# Patient Record
Sex: Female | Born: 1950
Health system: Southern US, Community
[De-identification: ages and names within clinical notes are randomized; demographics above are authoritative.]

## PROBLEM LIST (undated history)

## (undated) DIAGNOSIS — K219 Gastro-esophageal reflux disease without esophagitis: Secondary | ICD-10-CM

## (undated) DIAGNOSIS — F419 Anxiety disorder, unspecified: Secondary | ICD-10-CM

## (undated) DIAGNOSIS — R112 Nausea with vomiting, unspecified: Secondary | ICD-10-CM

## (undated) DIAGNOSIS — C4491 Basal cell carcinoma of skin, unspecified: Secondary | ICD-10-CM

## (undated) DIAGNOSIS — Z9889 Other specified postprocedural states: Secondary | ICD-10-CM

## (undated) DIAGNOSIS — E7800 Pure hypercholesterolemia, unspecified: Secondary | ICD-10-CM

## (undated) DIAGNOSIS — I10 Essential (primary) hypertension: Secondary | ICD-10-CM

## (undated) DIAGNOSIS — M719 Bursopathy, unspecified: Secondary | ICD-10-CM

## (undated) HISTORY — PX: INCISIONAL BREAST BIOPSY: SHX1812

---
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
## 1998-11-23 ENCOUNTER — Other Ambulatory Visit: Admission: RE | Admit: 1998-11-23 | Discharge: 1998-11-23 | Payer: Self-pay | Admitting: Gynecology

## 1999-12-09 ENCOUNTER — Other Ambulatory Visit: Admission: RE | Admit: 1999-12-09 | Discharge: 1999-12-09 | Payer: Self-pay | Admitting: Gynecology

## 2000-04-12 ENCOUNTER — Other Ambulatory Visit: Admission: RE | Admit: 2000-04-12 | Discharge: 2000-04-12 | Payer: Self-pay | Admitting: Gynecology

## 2000-06-02 ENCOUNTER — Encounter: Admission: RE | Admit: 2000-06-02 | Discharge: 2000-08-31 | Payer: Self-pay | Admitting: Internal Medicine

## 2000-10-02 ENCOUNTER — Other Ambulatory Visit: Admission: RE | Admit: 2000-10-02 | Discharge: 2000-10-02 | Payer: Self-pay | Admitting: Urology

## 2000-12-20 ENCOUNTER — Other Ambulatory Visit: Admission: RE | Admit: 2000-12-20 | Discharge: 2000-12-20 | Payer: Self-pay | Admitting: Gynecology

## 2002-01-02 ENCOUNTER — Other Ambulatory Visit: Admission: RE | Admit: 2002-01-02 | Discharge: 2002-01-02 | Payer: Self-pay | Admitting: Gynecology

## 2003-02-21 ENCOUNTER — Other Ambulatory Visit: Admission: RE | Admit: 2003-02-21 | Discharge: 2003-02-21 | Payer: Self-pay | Admitting: Obstetrics and Gynecology

## 2003-04-03 ENCOUNTER — Encounter: Admission: RE | Admit: 2003-04-03 | Discharge: 2003-04-03 | Payer: Self-pay | Admitting: Surgery

## 2003-04-03 ENCOUNTER — Encounter: Payer: Self-pay | Admitting: Surgery

## 2003-04-03 ENCOUNTER — Encounter (INDEPENDENT_AMBULATORY_CARE_PROVIDER_SITE_OTHER): Payer: Self-pay | Admitting: *Deleted

## 2003-06-06 ENCOUNTER — Ambulatory Visit (HOSPITAL_COMMUNITY): Admission: RE | Admit: 2003-06-06 | Discharge: 2003-06-06 | Payer: Self-pay | Admitting: Gastroenterology

## 2003-06-06 ENCOUNTER — Encounter (INDEPENDENT_AMBULATORY_CARE_PROVIDER_SITE_OTHER): Payer: Self-pay | Admitting: *Deleted

## 2004-03-23 ENCOUNTER — Other Ambulatory Visit: Admission: RE | Admit: 2004-03-23 | Discharge: 2004-03-23 | Payer: Self-pay | Admitting: Obstetrics and Gynecology

## 2005-04-01 ENCOUNTER — Other Ambulatory Visit: Admission: RE | Admit: 2005-04-01 | Discharge: 2005-04-01 | Payer: Self-pay | Admitting: Obstetrics and Gynecology

## 2005-12-22 ENCOUNTER — Encounter: Admission: RE | Admit: 2005-12-22 | Discharge: 2005-12-22 | Payer: Self-pay | Admitting: Obstetrics and Gynecology

## 2006-04-03 ENCOUNTER — Other Ambulatory Visit: Admission: RE | Admit: 2006-04-03 | Discharge: 2006-04-03 | Payer: Self-pay | Admitting: Obstetrics and Gynecology

## 2006-05-08 ENCOUNTER — Encounter: Admission: RE | Admit: 2006-05-08 | Discharge: 2006-05-08 | Payer: Self-pay | Admitting: Internal Medicine

## 2006-12-25 ENCOUNTER — Encounter: Admission: RE | Admit: 2006-12-25 | Discharge: 2006-12-25 | Payer: Self-pay | Admitting: Obstetrics and Gynecology

## 2007-01-08 ENCOUNTER — Encounter: Admission: RE | Admit: 2007-01-08 | Discharge: 2007-01-08 | Payer: Self-pay | Admitting: Obstetrics and Gynecology

## 2007-04-06 ENCOUNTER — Other Ambulatory Visit: Admission: RE | Admit: 2007-04-06 | Discharge: 2007-04-06 | Payer: Self-pay | Admitting: Obstetrics and Gynecology

## 2007-05-09 HISTORY — PX: OTHER SURGICAL HISTORY: SHX169

## 2007-06-05 ENCOUNTER — Ambulatory Visit (HOSPITAL_BASED_OUTPATIENT_CLINIC_OR_DEPARTMENT_OTHER): Admission: RE | Admit: 2007-06-05 | Discharge: 2007-06-06 | Payer: Self-pay | Admitting: Orthopedic Surgery

## 2007-08-07 ENCOUNTER — Encounter: Admission: RE | Admit: 2007-08-07 | Discharge: 2007-08-07 | Payer: Self-pay | Admitting: Obstetrics and Gynecology

## 2007-08-07 ENCOUNTER — Encounter (INDEPENDENT_AMBULATORY_CARE_PROVIDER_SITE_OTHER): Payer: Self-pay | Admitting: Diagnostic Radiology

## 2007-08-09 HISTORY — PX: BREAST EXCISIONAL BIOPSY: SUR124

## 2007-08-21 ENCOUNTER — Encounter: Admission: RE | Admit: 2007-08-21 | Discharge: 2007-08-21 | Payer: Self-pay | Admitting: Surgery

## 2007-09-14 ENCOUNTER — Ambulatory Visit (HOSPITAL_BASED_OUTPATIENT_CLINIC_OR_DEPARTMENT_OTHER): Admission: RE | Admit: 2007-09-14 | Discharge: 2007-09-14 | Payer: Self-pay | Admitting: Surgery

## 2007-09-14 ENCOUNTER — Encounter (INDEPENDENT_AMBULATORY_CARE_PROVIDER_SITE_OTHER): Payer: Self-pay | Admitting: Surgery

## 2007-09-14 ENCOUNTER — Encounter: Admission: RE | Admit: 2007-09-14 | Discharge: 2007-09-14 | Payer: Self-pay | Admitting: Surgery

## 2008-01-15 ENCOUNTER — Encounter: Admission: RE | Admit: 2008-01-15 | Discharge: 2008-01-15 | Payer: Self-pay | Admitting: Surgery

## 2008-04-11 ENCOUNTER — Other Ambulatory Visit: Admission: RE | Admit: 2008-04-11 | Discharge: 2008-04-11 | Payer: Self-pay | Admitting: Obstetrics and Gynecology

## 2008-06-03 ENCOUNTER — Encounter: Admission: RE | Admit: 2008-06-03 | Discharge: 2008-06-03 | Payer: Self-pay | Admitting: Family Medicine

## 2009-01-15 ENCOUNTER — Encounter: Admission: RE | Admit: 2009-01-15 | Discharge: 2009-01-15 | Payer: Self-pay | Admitting: Obstetrics and Gynecology

## 2009-09-30 ENCOUNTER — Encounter: Admission: RE | Admit: 2009-09-30 | Discharge: 2009-09-30 | Payer: Self-pay | Admitting: Gastroenterology

## 2010-01-19 ENCOUNTER — Encounter: Admission: RE | Admit: 2010-01-19 | Discharge: 2010-01-19 | Payer: Self-pay | Admitting: Obstetrics and Gynecology

## 2010-08-29 ENCOUNTER — Encounter: Payer: Self-pay | Admitting: Obstetrics and Gynecology

## 2010-12-20 ENCOUNTER — Other Ambulatory Visit: Payer: Self-pay | Admitting: Surgery

## 2010-12-20 DIAGNOSIS — Z9889 Other specified postprocedural states: Secondary | ICD-10-CM

## 2010-12-21 NOTE — Op Note (Signed)
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   OPERATION:  Needle guided excisional biopsy.   SURGEON:  Currie Paris, M.D.   ANESTHESIA:  MAC.   CLINICAL HISTORY:  This is a 60 year old lady that had an area of  calcifications develop and biopsy performed.  It showed some LCIS but  there was some concern that there was also some other underlying process  and could be adjacent carcinoma.  Therefore a needle guided excisional  biopsy was recommended.  A preoperative MRI showed only post biopsy  changes in the left breast and nothing in the right.   DESCRIPTION OF PROCEDURE:  The patient was seen in the holding area and  she had no further questions.  We confirmed the left breast as the  operative side and she and I both initialed that side.  I reviewed the  mammogram films with the guidewire placement.   The patient was taken to the operating room and given satisfactory IV  sedation.  The left breast was prepped and draped.  The time-out was  performed.   A combination of 1% Xylocaine mixed equally with 0.5% Marcaine was used  for local.  The guidewire entered at about the 10 o'clock position above  the areolar margin by about 4 cm and seemed to track inferiorly towards  the areola and laterally.  I made my incision about 1.5 cm below the  guidewire entry site.  I divided the fatty tissue until I got down to  where I saw more dense tissue  consistent with the beginning of breast  tissue.  I then elevated the superior flap over so I could see the  guidewire and manipulated that into the wound.   I then began taking an excisional biopsy around the guidewire fairly  much of a cylinder of tissue around it which therefore was going  inferior and lateral and deep from the entry of the guidewire.  Along  the inferior margin I ran across a very hard area that I divided through  as we were coming down and continued until I got beyond the tip of the  wire which was going to be well beyond the area seen on specimen  mammogram.  This was all done with cautery.  I oriented the specimen  somewhat as best I could for the pathologist.  We did a specimen  mammogram which showed the clip in the middle of the tissue removed.  I  had also put a surgical clip on the edge of the specimen in the area  that  I thought was worrisome by palpation.  I spoke with Dr. Manson Passey who  confirmed the removal of the clip and was concerned also about a little  spiculated density between the clip I had placed and the clip placed at  her biopsy.   I went back to examine the breast and went ahead and took an extra about  1 cm thick tissue from the inferior margin at the area that I had  thought was close on and by the hard area.  This was sent as a separate  specimen, labeled and oriented as to the new deep margin.   I spent several minutes irrigating.  I put more local in as we worked  and the patient remained comfortable.  Once I thought everything was dry  I closed some of the deeper tissue to cover the muscle which I had  gotten down to as deep margin.  I then closed the more superficial  tissue with 3-0 Vicryl followed by 4-0 Monocryl subcuticular and  Dermabond.  The patient tolerated the procedure well and there were no  complications.  All counts were correct.      Currie Paris, M.D.  Electronically Signed     CJS/MEDQ  D:  09/14/2007  T:   09/15/2007  Job:  [id number]   cc:   Olene Craven, M.D.

## 2010-12-21 NOTE — Op Note (Signed)
Haley [name]              ACCOUNT NO.:  [id number]   MEDICAL RECORD NO.:  [id number]          PATIENT TYPE:  AMB   LOCATION:  DSC                          FACILITY:  [facility]   PHYSICIAN:  Katy Fitch. Sypher, M.D. DATE OF BIRTH:  05-29-1951   DATE OF PROCEDURE:  06/05/2007  DATE OF DISCHARGE:                               OPERATIVE REPORT   PREOPERATIVE DIAGNOSIS:  Chronic right rotator cuff tear with plain film  evidence of significant acromioclavicular joint degenerative arthritis  and a retracted rotator cuff tear involving the supraspinatus and  conjoint tendon documented by preoperative MRI.   POSTOPERATIVE DIAGNOSES:  1. Chronic right rotator cuff tear with plain film evidence of      significant acromioclavicular joint degenerative arthritis and a      retracted rotator cuff tear involving the supraspinatus and      conjoint tendon documented by preoperative MRI.  2. 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   OPERATION:  1. Examination of right shoulder under anesthesia documenting capsular      stability.  2. Arthroscopic debridement of right glenohumeral joint including      labral degenerative tear and synovitis with documentation of a thin      membrane of intact supraspinatus and infraspinatus tendons on the      joint side.  3. Arthroscopic subacromial decompression with bursectomy,      coracoacromial ligament relaxation and acromioplasty, with      documentation of 80+ percent retracted supraspinatus and conjoint      tendon rotator cuff tear.  4. Arthroscopic distal clavicle resection.  5. Open reconstruction of supraspinatus and infraspinatus rotator cuff      tendons with debridement of necrotic tendon and advancement of      tendon to anatomic footprint on greater tuberosity utilizing a bio-      corkscrew medial footprint anchor and a McLaughlin through-bone  lateralization suture of the conjoint tendon.   OPERATING SURGEON:  Katy Fitch. Sypher, M.D.   ASSISTANT:  Annye Rusk, PA-C.   ANESTHESIA:  General by endotracheal technique, supervising  anesthesiologist is Bedelia Person, M.D.  Note that Dr. Gypsy Balsam placed a  preoperative interscalene block for perioperative analgesia.   ESTIMATED BLOOD LOSS:  100 mL.  No replacement.   PREOPERATIVE ANTIBIOTICS:  Ancef 1 g IV.   COMPLICATIONS:  None apparent.   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   Clinical examination revealed signs of AC arthropathy and a significant  stage II or III impingement.  An MRI of the shoulder documented a  retracted supraspinatus and conjoint tendon tear that was thought to be  full-thickness by the radiologist.   Due to a failure to respond to nonoperative measures, she is brought to  the operating room at this time anticipating arthroscopic evaluation of  the glenohumeral joint with appropriate intervention, followed by  arthroscopic subacromial decompression, distal clavicle resection and  probable open reconstruction  of her rotator cuff.   We were prepared to consider and execute an arthroscopic repair  depending on our findings; however, given our identification of a  partial-thickness tear, in my judgment open technique offered  advantages.   After informed consent and a repeat period of questions and answers in  the holding area, [name] was brought to the operating room at this  time.   PROCEDURE:  [name]. [name] was brought to the operating room and  placed in supine position on the operating table.   Following an anesthesia consult with Dr. Gypsy Balsam, a right interscalene  block was placed without complication.   [name] was brought to room #1, placed in supine position upon the  table and, under Dr. Burnett Corrente strict supervision,  general endotracheal  anesthesia induced.   She was carefully positioned in the beach-chair position with a aid of a  torso and head holder designed for shoulder arthroscopy.  Preoperative  examination of the shoulder under anesthesia revealed combined elevation  to 190 degrees, external rotation 90 degrees at 90 degrees of abduction,  internal rotation of 80 degrees at 90 degrees abduction.  She had no  sign of anterior, inferior or posterior instability.   The arm was then prepped with DuraPrep and draped with impervious  arthroscopy drapes.  The scope was placed through a standard posterior  viewing portal.  Diagnostic arthroscopy revealed intact hyaline  articular cartilage surfaces on the glenoid and humeral head.  There was  considerable synovitis and degenerative labral tearing from 2 o'clock  anteriorly to 10 o'clock posteriorly.  The inferior recess was inspected  and found to have a thick glenohumeral effusion but no sign of  significant loose bodies.   The deep surface of the rotator cuff was inspected and except for a  small rotator cuff interval tear anteriorly, the joint side of the  tendons were at least intact with a membrane.  The biceps had a 25%  degenerative tear at the entrance into the bicipital groove.   A suction shaver was introduced and the labrum synovitis and biceps as  well as the anterior supraspinatus were all debrided to a smooth margin.  Hemostasis was achieved with the bipolar cautery.   The scope was then removed from the glenohumeral joint and placed in the  subacromial space.  Florid bursitis was noted.  After bursectomy, the  coracoacromial ligament was relaxed with the cutting cautery, followed  by leveling the acromion to a type 1 morphology.  The distal clavicle  capsule was taken down.  Photographic documentation of the arthritis was  accomplished with a digital camera, followed by resection of the distal  15 mm of clavicle with the  endoscopic bur.  Hemostasis was achieved.  The bursal side tear was easily identified and documented with the  digital camera.  This involved the entire supraspinatus tendon and a  portion of the conjoint tendon.  It had retracted more than 2 cm.   At this point due to the partial-thickness nature of the tear, I elected  to proceed to open reconstruction of the rotator cuff.   A 3-cm incision was fashioned at the junction of the anterior and middle  thirds of the deltoid.  The bursa was resected and the cuff margins  freshened with the 15 scalpel blade.   A gathering #2 FiberWire was placed with care to relaminate the tendon  that had retracted medially.  Bone tunnels were created with an RC  needle and,  in the case the anterior tunnel, the Concept drill set.  Ms.  Ebbert has a bone island in her greater tuberosity, which rendered bone  tunneling quite challenging.   Ultimately a proper McLaughlin suture was placed, which anatomically  lateralized the conjoint tendon.  A single 5.5-mm bio-corkscrew anchor  was placed medially after decortication of the greater tuberosity and  lowering the profile of the tuberosity approximately 3 mm.   The tendon was advanced laterally and the mattress sutures inset,  creating an excellent medial footprint and relaminating the tendon.  An  anatomic repair was achieved.  The margin of the repair was finished  with a 0 Vicryl figure-of-eight suture brought through the periosteum  laterally, insetting the edge smoothly.   The subacromial space was then power-lavaged with the arthroscopic pump,  followed by repair of the deltoid with an apical corner suture of #2  FiberWire and figure-of-eight sutures of 0 Vicryl.  The skin was  repaired with subdermal suture of 2-0 Vicryl with intradermal 3-0  Prolene with Steri-Strips.   Ms. Ivens tolerated the surgery and anesthesia well.  She was  transferred to the recovery room with stable vital  signs.      Katy Fitch Sypher, M.D.  Electronically Signed     RVS/MEDQ  D:  06/05/2007  T:  06/05/2007  Job:  [id number]   cc:   Olene Craven, M.D.

## 2010-12-24 NOTE — Op Note (Signed)
Haley [name], Haley [name]                        ACCOUNT NO.:  [id number]   MEDICAL RECORD NO.:  [id number]                   PATIENT TYPE:  AMB   LOCATION:  ENDO                                 FACILITY:  [facility]   PHYSICIAN:  Anselmo Rod, M.D.               DATE OF BIRTH:  04-Apr-1951   DATE OF PROCEDURE:  06/06/2003  DATE OF DISCHARGE:                                 OPERATIVE REPORT   PROCEDURE PERFORMED:  Colonoscopy with snare polypectomy times one.   ENDOSCOPIST:  Charna Elizabeth, M.D.   INSTRUMENT USED:  Olympus video colonoscope.   INDICATIONS FOR PROCEDURE:  The patient is a 60 year old white female with a  history of rectal bleeding.  Rule out colonic polyps, masses, etc.   PREPROCEDURE PREPARATION:  Informed consent was procured from the patient.  The patient was fasted for eight hours prior to the procedure and prepped  with a bottle of magnesium citrate and a gallon of GoLYTELY the night prior  to the procedure.   PREPROCEDURE PHYSICAL:  The patient had stable vital signs.  Neck supple.  Chest clear to auscultation.  S1 and S2 regular.  Abdomen soft with normal  bowel sounds.   DESCRIPTION OF PROCEDURE:  The patient was placed in left lateral decubitus  position and sedated with 100 mg of Demerol and 10 mg of Versed  intravenously.  Once the patient was adequately sedated and maintained on  low flow oxygen and continuous cardiac monitoring, the Olympus video  colonoscope was advanced from the rectum to the cecum without difficulty.  The patient had a very tortuous atonic colon.  The polyp was snared from the  midhepatic flexure.  Small internal and external hemorrhoids were seen on  anal inspection and retroflexion in the rectum.  There was a large amount of  residual stool in the colon and multiple washes were done.  The patient's  position was changed from the left lateral to the supine and the right  lateral position. With gentle application of abdominal pressure  to reach the  cecum, the appendicular orifice and ileocecal valve were clearly visualized.  The terminal ileum was not seen.  The patient tolerated the procedure well  without complications.   IMPRESSION:  1. Prominent internal and external hemorrhoids.  2. Small polyp snared from midtransverse colon.  3. No evidence of diverticulosis.   RECOMMENDATIONS:  1. Await pathology results.  2. Repeat colorectal cancer screening depending on pathology results.  3. Avoid aspirin and all other nonsteroidals for the next two to three     weeks.  4. Outpatient follow-up in the next two weeks or earlier if need be for     further recommendations.  Anselmo Rod, M.D.    JNM/MEDQ  D:  06/06/2003  T:  06/06/2003  Job:  [id number]   cc:   Olene Craven, M.D.  [address]  [address]  [place]  [place] [address]  Fax: [phone]   Edwena Felty. Romine, M.D.  [address]  [place]  [place] [address]  Fax: [phone]

## 2011-01-21 ENCOUNTER — Ambulatory Visit
Admission: RE | Admit: 2011-01-21 | Discharge: 2011-01-21 | Disposition: A | Discharge status: Home / Self Care | Payer: Commercial Managed Care - PPO | Source: Ambulatory Visit | Attending: Surgery | Admitting: Surgery

## 2011-01-21 DIAGNOSIS — Z9889 Other specified postprocedural states: Secondary | ICD-10-CM

## 2011-04-29 LAB — BASIC METABOLIC PANEL
Calcium: 9.4
Creatinine, Ser: 0.61
GFR calc Af Amer: >60
GFR calc non Af Amer: >60
Sodium: 134 — ABNORMAL LOW

## 2011-05-18 LAB — BASIC METABOLIC PANEL
CO2: 30
Chloride: 96
GFR calc Af Amer: >60
Glucose, Bld: 118 — ABNORMAL HIGH
Sodium: 133 — ABNORMAL LOW

## 2011-05-18 LAB — POCT HEMOGLOBIN-HEMACUE: Hemoglobin: 15.1 — ABNORMAL HIGH

## 2011-06-08 ENCOUNTER — Other Ambulatory Visit (HOSPITAL_COMMUNITY)
Admission: RE | Admit: 2011-06-08 | Discharge: 2011-06-08 | Disposition: A | Discharge status: Home / Self Care | Payer: BC Managed Care – PPO | Source: Ambulatory Visit | Attending: Family Medicine | Admitting: Family Medicine

## 2011-06-08 ENCOUNTER — Other Ambulatory Visit: Payer: Self-pay | Admitting: Family Medicine

## 2011-06-08 DIAGNOSIS — Z0000 Encounter for general adult medical examination without abnormal findings: Secondary | ICD-10-CM | POA: Insufficient documentation

## 2011-12-05 ENCOUNTER — Other Ambulatory Visit: Payer: Self-pay | Admitting: Dermatology

## 2011-12-19 ENCOUNTER — Other Ambulatory Visit: Payer: Self-pay | Admitting: Family Medicine

## 2011-12-19 DIAGNOSIS — Z853 Personal history of malignant neoplasm of breast: Secondary | ICD-10-CM

## 2011-12-19 DIAGNOSIS — Z9889 Other specified postprocedural states: Secondary | ICD-10-CM

## 2012-01-25 ENCOUNTER — Ambulatory Visit
Admission: RE | Admit: 2012-01-25 | Discharge: 2012-01-25 | Disposition: A | Discharge status: Home / Self Care | Payer: BC Managed Care – PPO | Source: Ambulatory Visit | Attending: Family Medicine | Admitting: Family Medicine

## 2012-01-25 DIAGNOSIS — Z9889 Other specified postprocedural states: Secondary | ICD-10-CM

## 2012-01-25 DIAGNOSIS — Z853 Personal history of malignant neoplasm of breast: Secondary | ICD-10-CM

## 2012-03-29 ENCOUNTER — Other Ambulatory Visit: Payer: Self-pay | Admitting: Dermatology

## 2012-12-17 ENCOUNTER — Other Ambulatory Visit: Payer: Self-pay | Admitting: Family Medicine

## 2012-12-17 DIAGNOSIS — Z1231 Encounter for screening mammogram for malignant neoplasm of breast: Secondary | ICD-10-CM

## 2013-01-25 ENCOUNTER — Ambulatory Visit: Payer: BC Managed Care – PPO

## 2013-02-05 ENCOUNTER — Ambulatory Visit
Admission: RE | Admit: 2013-02-05 | Discharge: 2013-02-05 | Disposition: A | Discharge status: Home / Self Care | Payer: BC Managed Care – PPO | Source: Ambulatory Visit | Attending: Family Medicine | Admitting: Family Medicine

## 2013-02-05 DIAGNOSIS — Z1231 Encounter for screening mammogram for malignant neoplasm of breast: Secondary | ICD-10-CM

## 2013-12-13 ENCOUNTER — Other Ambulatory Visit (HOSPITAL_COMMUNITY)
Admission: RE | Admit: 2013-12-13 | Discharge: 2013-12-13 | Disposition: A | Discharge status: Home / Self Care | Payer: 59 | Source: Ambulatory Visit | Attending: Family Medicine | Admitting: Family Medicine

## 2013-12-13 ENCOUNTER — Other Ambulatory Visit: Payer: Self-pay | Admitting: Family Medicine

## 2013-12-13 DIAGNOSIS — Z0000 Encounter for general adult medical examination without abnormal findings: Secondary | ICD-10-CM | POA: Insufficient documentation

## 2014-01-09 ENCOUNTER — Other Ambulatory Visit: Payer: Self-pay

## 2014-01-09 DIAGNOSIS — Z1231 Encounter for screening mammogram for malignant neoplasm of breast: Secondary | ICD-10-CM

## 2014-02-06 ENCOUNTER — Encounter (INDEPENDENT_AMBULATORY_CARE_PROVIDER_SITE_OTHER): Payer: Self-pay

## 2014-02-06 ENCOUNTER — Ambulatory Visit
Admission: RE | Admit: 2014-02-06 | Discharge: 2014-02-06 | Disposition: A | Discharge status: Home / Self Care | Payer: 59 | Source: Ambulatory Visit

## 2014-02-06 DIAGNOSIS — Z1231 Encounter for screening mammogram for malignant neoplasm of breast: Secondary | ICD-10-CM

## 2014-02-13 ENCOUNTER — Other Ambulatory Visit: Payer: Self-pay | Admitting: Family Medicine

## 2014-02-13 DIAGNOSIS — R928 Other abnormal and inconclusive findings on diagnostic imaging of breast: Secondary | ICD-10-CM

## 2014-02-20 ENCOUNTER — Ambulatory Visit
Admission: RE | Admit: 2014-02-20 | Discharge: 2014-02-20 | Disposition: A | Discharge status: Home / Self Care | Payer: 59 | Source: Ambulatory Visit | Attending: Family Medicine | Admitting: Family Medicine

## 2014-02-20 DIAGNOSIS — R928 Other abnormal and inconclusive findings on diagnostic imaging of breast: Secondary | ICD-10-CM

## 2014-05-23 ENCOUNTER — Other Ambulatory Visit: Payer: Self-pay | Admitting: Family Medicine

## 2014-05-23 DIAGNOSIS — N644 Mastodynia: Secondary | ICD-10-CM

## 2014-06-03 ENCOUNTER — Encounter (INDEPENDENT_AMBULATORY_CARE_PROVIDER_SITE_OTHER): Payer: Self-pay

## 2014-06-03 ENCOUNTER — Ambulatory Visit
Admission: RE | Admit: 2014-06-03 | Discharge: 2014-06-03 | Disposition: A | Discharge status: Home / Self Care | Payer: BC Managed Care – PPO | Source: Ambulatory Visit | Attending: Family Medicine | Admitting: Family Medicine

## 2014-06-03 DIAGNOSIS — N644 Mastodynia: Secondary | ICD-10-CM

## 2014-10-07 ENCOUNTER — Other Ambulatory Visit: Payer: Self-pay | Admitting: Dermatology

## 2015-01-19 ENCOUNTER — Other Ambulatory Visit: Payer: Self-pay

## 2015-01-19 DIAGNOSIS — Z1231 Encounter for screening mammogram for malignant neoplasm of breast: Secondary | ICD-10-CM

## 2015-02-23 ENCOUNTER — Ambulatory Visit
Admission: RE | Admit: 2015-02-23 | Discharge: 2015-02-23 | Disposition: A | Discharge status: Home / Self Care | Payer: BLUE CROSS/BLUE SHIELD | Source: Ambulatory Visit

## 2015-02-23 DIAGNOSIS — Z1231 Encounter for screening mammogram for malignant neoplasm of breast: Secondary | ICD-10-CM

## 2016-01-28 ENCOUNTER — Other Ambulatory Visit: Payer: Self-pay | Admitting: Family Medicine

## 2016-01-28 DIAGNOSIS — Z853 Personal history of malignant neoplasm of breast: Secondary | ICD-10-CM

## 2016-01-28 DIAGNOSIS — Z1231 Encounter for screening mammogram for malignant neoplasm of breast: Secondary | ICD-10-CM

## 2016-02-23 ENCOUNTER — Ambulatory Visit
Admission: RE | Admit: 2016-02-23 | Discharge: 2016-02-23 | Disposition: A | Discharge status: Home / Self Care | Payer: BLUE CROSS/BLUE SHIELD | Source: Ambulatory Visit | Attending: Family Medicine | Admitting: Family Medicine

## 2016-02-23 ENCOUNTER — Other Ambulatory Visit: Payer: Self-pay | Admitting: Family Medicine

## 2016-02-23 DIAGNOSIS — M542 Cervicalgia: Secondary | ICD-10-CM

## 2016-02-24 ENCOUNTER — Ambulatory Visit
Admission: RE | Admit: 2016-02-24 | Discharge: 2016-02-24 | Disposition: A | Discharge status: Home / Self Care | Payer: BLUE CROSS/BLUE SHIELD | Source: Ambulatory Visit | Attending: Family Medicine | Admitting: Family Medicine

## 2016-02-24 DIAGNOSIS — Z1231 Encounter for screening mammogram for malignant neoplasm of breast: Secondary | ICD-10-CM

## 2016-02-24 DIAGNOSIS — Z853 Personal history of malignant neoplasm of breast: Secondary | ICD-10-CM

## 2016-09-01 ENCOUNTER — Other Ambulatory Visit (HOSPITAL_COMMUNITY)
Admission: RE | Admit: 2016-09-01 | Discharge: 2016-09-01 | Disposition: A | Discharge status: Home / Self Care | Payer: BLUE CROSS/BLUE SHIELD | Source: Ambulatory Visit | Attending: Family Medicine | Admitting: Family Medicine

## 2016-09-01 ENCOUNTER — Other Ambulatory Visit: Payer: Self-pay | Admitting: Family Medicine

## 2016-09-01 ENCOUNTER — Other Ambulatory Visit: Payer: Self-pay | Admitting: Orthopedic Surgery

## 2016-09-01 DIAGNOSIS — T148XXA Other injury of unspecified body region, initial encounter: Secondary | ICD-10-CM

## 2016-09-01 DIAGNOSIS — Z124 Encounter for screening for malignant neoplasm of cervix: Secondary | ICD-10-CM | POA: Insufficient documentation

## 2016-09-02 LAB — CYTOLOGY - PAP: DIAGNOSIS: NEGATIVE

## 2016-09-05 ENCOUNTER — Ambulatory Visit
Admission: RE | Admit: 2016-09-05 | Discharge: 2016-09-05 | Disposition: A | Discharge status: Home / Self Care | Payer: BLUE CROSS/BLUE SHIELD | Source: Ambulatory Visit | Attending: Orthopedic Surgery | Admitting: Orthopedic Surgery

## 2016-09-05 DIAGNOSIS — T148XXA Other injury of unspecified body region, initial encounter: Secondary | ICD-10-CM

## 2016-11-03 ENCOUNTER — Ambulatory Visit (INDEPENDENT_AMBULATORY_CARE_PROVIDER_SITE_OTHER): Payer: BLUE CROSS/BLUE SHIELD | Admitting: Orthopedic Surgery

## 2016-11-03 ENCOUNTER — Ambulatory Visit (INDEPENDENT_AMBULATORY_CARE_PROVIDER_SITE_OTHER): Payer: BLUE CROSS/BLUE SHIELD

## 2016-11-03 DIAGNOSIS — M76822 Posterior tibial tendinitis, left leg: Secondary | ICD-10-CM | POA: Diagnosis not present

## 2016-11-03 DIAGNOSIS — M79672 Pain in left foot: Secondary | ICD-10-CM | POA: Diagnosis not present

## 2016-11-03 NOTE — Progress Notes (Signed)
Office Visit Note   Patient: Haley [name]           Date of Birth: 16-Feb-1951           MRN: [id number] Visit Date: 11/03/2016              Requested by: Harlan Stains, MD [address], [place] [address] PCP: No primary care provider on file.  Chief Complaint  Patient presents with  . Left Foot - Pain      HPI: Patient is a 66 year old woman who has seen Dr. Beola Cord and also seen Dr. Doran Durand. She was recommended ice and orthotics were made for her at [facility]. She has pain over the posterior tibial tendon pain across the arch she has a mobilizer self this past week it is actually feeling better after decreasing her activities. Patient complains of pain and swelling along the posterior tibial tendon.  Assessment & Plan: Visit Diagnoses:  1. Pain in left foot   2. 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  Plan: Recommended a harder orthotics to provide better arch support such as sole orthotics. Discussed that if her symptoms worsen she can wear her fracture boot with the orthotic in place. Discussed that if she fails conservative treatment her option would be to consider a talonavicular and subtalar fusion to support the insufficiency of the posterior tibial tendon. We had a long discussion regarding conservative therapy and I have encouraged conservative therapy. Follow-up as needed.  Follow-Up Instructions: Return if symptoms worsen or fail to improve.   Ortho Exam  Patient is alert, oriented, no adenopathy, well-dressed, normal affect, normal respiratory effort. Examination patient is alert oriented no adenopathy well-dressed normal affect normal respiratory effort she has a good dorsalis pes and posterior tibial pulse. She has advanced collapse with posterior tibial tendon insufficiency with a pronated valgus forefoot. Her subtalar joint is essentially non-mobile due to  the collapse. She has some tenderness to palpation of the sinus Tarsi tenderness and swelling to palpation along the posterior tibial tendon. Patient cannot do a single limb heel raise and cannot even get her heel off the ground.  Imaging: Xr Foot 2 Views Left  Result Date: 11/03/2016 2 view radiographs of the left foot shows pes planus with a non-congruent joint at the tibiotalar joint. Patient on a nonweightbearing film has good alignment of the medial column. Radiographs show collapse through the subtalar joint.   Labs: No results found for: HGBA1C, ESRSEDRATE, CRP, LABURIC, REPTSTATUS, GRAMSTAIN, CULT, LABORGA  Orders:  Orders Placed This Encounter  Procedures  . XR Foot 2 Views Left   No orders of the defined types were placed in this encounter.    Procedures: No procedures performed  Clinical Data: No additional findings.  ROS:  All other systems negative, except as noted in the HPI. Review of Systems  Objective: Vital Signs: There were no vitals taken for this visit.  Specialty Comments:  No specialty comments available.  PMFS History: There are no active problems to display for this patient.  No past medical history on file.  No family history on file.  No past surgical history on file. Social History   Occupational History  . Not on file.   Social History Main Topics  . Smoking status: Not on file  . Smokeless tobacco: Not on file  . Alcohol use Not on file  . Drug use: Unknown  . Sexual activity:  Not on file

## 2017-01-13 ENCOUNTER — Other Ambulatory Visit: Payer: Self-pay | Admitting: Family Medicine

## 2017-01-13 DIAGNOSIS — Z1231 Encounter for screening mammogram for malignant neoplasm of breast: Secondary | ICD-10-CM

## 2017-02-24 ENCOUNTER — Ambulatory Visit
Admission: RE | Admit: 2017-02-24 | Discharge: 2017-02-24 | Disposition: A | Discharge status: Home / Self Care | Payer: BLUE CROSS/BLUE SHIELD | Source: Ambulatory Visit | Attending: Family Medicine | Admitting: Family Medicine

## 2017-02-24 DIAGNOSIS — Z1231 Encounter for screening mammogram for malignant neoplasm of breast: Secondary | ICD-10-CM

## 2017-11-29 ENCOUNTER — Ambulatory Visit: Payer: BLUE CROSS/BLUE SHIELD | Admitting: Podiatry

## 2017-12-20 DIAGNOSIS — C44619 Basal cell carcinoma of skin of left upper limb, including shoulder: Secondary | ICD-10-CM | POA: Diagnosis not present

## 2018-01-22 ENCOUNTER — Other Ambulatory Visit: Payer: Self-pay | Admitting: Family Medicine

## 2018-01-22 DIAGNOSIS — Z1231 Encounter for screening mammogram for malignant neoplasm of breast: Secondary | ICD-10-CM

## 2018-01-26 DIAGNOSIS — M7061 Trochanteric bursitis, right hip: Secondary | ICD-10-CM | POA: Diagnosis not present

## 2018-01-26 DIAGNOSIS — M1611 Unilateral primary osteoarthritis, right hip: Secondary | ICD-10-CM | POA: Diagnosis not present

## 2018-01-29 DIAGNOSIS — H2513 Age-related nuclear cataract, bilateral: Secondary | ICD-10-CM | POA: Diagnosis not present

## 2018-01-29 DIAGNOSIS — H5203 Hypermetropia, bilateral: Secondary | ICD-10-CM | POA: Diagnosis not present

## 2018-02-07 DIAGNOSIS — M1611 Unilateral primary osteoarthritis, right hip: Secondary | ICD-10-CM | POA: Diagnosis not present

## 2018-02-26 ENCOUNTER — Ambulatory Visit
Admission: RE | Admit: 2018-02-26 | Discharge: 2018-02-26 | Disposition: A | Discharge status: Home / Self Care | Payer: Medicare Other | Source: Ambulatory Visit | Attending: Family Medicine | Admitting: Family Medicine

## 2018-02-26 DIAGNOSIS — Z1231 Encounter for screening mammogram for malignant neoplasm of breast: Secondary | ICD-10-CM | POA: Diagnosis not present

## 2018-03-08 DIAGNOSIS — M7061 Trochanteric bursitis, right hip: Secondary | ICD-10-CM | POA: Diagnosis not present

## 2018-03-08 DIAGNOSIS — M1611 Unilateral primary osteoarthritis, right hip: Secondary | ICD-10-CM | POA: Diagnosis not present

## 2018-03-09 DIAGNOSIS — M7061 Trochanteric bursitis, right hip: Secondary | ICD-10-CM | POA: Diagnosis not present

## 2018-03-23 DIAGNOSIS — M1611 Unilateral primary osteoarthritis, right hip: Secondary | ICD-10-CM | POA: Diagnosis not present

## 2018-03-23 DIAGNOSIS — M7061 Trochanteric bursitis, right hip: Secondary | ICD-10-CM | POA: Diagnosis not present

## 2018-04-06 DIAGNOSIS — R21 Rash and other nonspecific skin eruption: Secondary | ICD-10-CM | POA: Diagnosis not present

## 2018-04-16 DIAGNOSIS — F419 Anxiety disorder, unspecified: Secondary | ICD-10-CM | POA: Diagnosis not present

## 2018-04-16 DIAGNOSIS — E785 Hyperlipidemia, unspecified: Secondary | ICD-10-CM | POA: Diagnosis not present

## 2018-04-16 DIAGNOSIS — K120 Recurrent oral aphthae: Secondary | ICD-10-CM | POA: Diagnosis not present

## 2018-04-16 DIAGNOSIS — E871 Hypo-osmolality and hyponatremia: Secondary | ICD-10-CM | POA: Diagnosis not present

## 2018-04-16 DIAGNOSIS — G4700 Insomnia, unspecified: Secondary | ICD-10-CM | POA: Diagnosis not present

## 2018-04-16 DIAGNOSIS — E559 Vitamin D deficiency, unspecified: Secondary | ICD-10-CM | POA: Diagnosis not present

## 2018-04-16 DIAGNOSIS — I10 Essential (primary) hypertension: Secondary | ICD-10-CM | POA: Diagnosis not present

## 2018-04-16 DIAGNOSIS — Z23 Encounter for immunization: Secondary | ICD-10-CM | POA: Diagnosis not present

## 2018-05-02 DIAGNOSIS — L299 Pruritus, unspecified: Secondary | ICD-10-CM | POA: Diagnosis not present

## 2018-05-07 DIAGNOSIS — L309 Dermatitis, unspecified: Secondary | ICD-10-CM | POA: Diagnosis not present

## 2018-05-07 DIAGNOSIS — Z23 Encounter for immunization: Secondary | ICD-10-CM | POA: Diagnosis not present

## 2018-05-07 DIAGNOSIS — L905 Scar conditions and fibrosis of skin: Secondary | ICD-10-CM | POA: Diagnosis not present

## 2018-05-14 NOTE — Patient Instructions (Signed)
Haley [name]  05/14/2018   Your procedure is scheduled on: 05-23-18   Report to [facility] Main  Entrance    Report to admitting at 8:30AM    Call this number if you have problems the morning of surgery [phone]     Remember: Do not eat food or drink liquids :After Midnight. BRUSH YOUR TEETH MORNING OF SURGERY AND RINSE YOUR MOUTH OUT, NO CHEWING GUM CANDY OR MINTS.     Take these medicines the morning of surgery with A SIP OF WATER: amlodipine, Claritin, xanax if needed                                You may not have any metal on your body including hair pins and              piercings  Do not wear jewelry, make-up, lotions, powders or perfumes, deodorant             Do not wear nail polish.  Do not shave  48 hours prior to surgery.      Do not bring valuables to the hospital. [place].  Contacts, dentures or bridgework may not be worn into surgery.  Leave suitcase in the car. After surgery it may be brought to your room.                 Please read over the following fact sheets you were given: _____________________________________________________________________             [facility] - Preparing for Surgery Before surgery, you can play an important role.  Because skin is not sterile, your skin needs to be as free of germs as possible.  You can reduce the number of germs on your skin by washing with CHG (chlorahexidine gluconate) soap before surgery.  CHG is an antiseptic cleaner which kills germs and bonds with the skin to continue killing germs even after washing. Please DO NOT use if you have an allergy to CHG or antibacterial soaps.  If your skin becomes reddened/irritated stop using the CHG and inform your nurse when you arrive at Short Stay. Do not shave (including legs and underarms) for at least 48 hours prior to the first CHG shower.  You may shave your face/neck. Please follow  these instructions carefully:  1.  Shower with CHG Soap the night before surgery and the  morning of Surgery.  2.  If you choose to wash your hair, wash your hair first as usual with your  normal  shampoo.  3.  After you shampoo, rinse your hair and body thoroughly to remove the  shampoo.                           4.  Use CHG as you would any other liquid soap.  You can apply chg directly  to the skin and wash                       Gently with a scrungie or clean washcloth.  5.  Apply the CHG Soap to your body ONLY FROM THE NECK DOWN.   Do not use on face/ open  Wound or open sores. Avoid contact with eyes, ears mouth and genitals (private parts).                       Wash face,  Genitals (private parts) with your normal soap.             6.  Wash thoroughly, paying special attention to the area where your surgery  will be performed.  7.  Thoroughly rinse your body with warm water from the neck down.  8.  DO NOT shower/wash with your normal soap after using and rinsing off  the CHG Soap.                9.  Pat yourself dry with a clean towel.            10.  Wear clean pajamas.            11.  Place clean sheets on your bed the night of your first shower and do not  sleep with pets. Day of Surgery : Do not apply any lotions/deodorants the morning of surgery.  Please wear clean clothes to the hospital/surgery center.  FAILURE TO FOLLOW THESE INSTRUCTIONS MAY RESULT IN THE CANCELLATION OF YOUR SURGERY PATIENT SIGNATURE_________________________________  NURSE SIGNATURE__________________________________  ________________________________________________________________________   Adam Phenix  An incentive spirometer is a tool that can help keep your lungs clear and active. This tool measures how well you are filling your lungs with each breath. Taking long deep breaths may help reverse or decrease the chance of developing breathing (pulmonary) problems  (especially infection) following:  A long period of time when you are unable to move or be active. BEFORE THE PROCEDURE   If the spirometer includes an indicator to show your best effort, your nurse or respiratory therapist will set it to a desired goal.  If possible, sit up straight or lean slightly forward. Try not to slouch.  Hold the incentive spirometer in an upright position. INSTRUCTIONS FOR USE  1. Sit on the edge of your bed if possible, or sit up as far as you can in bed or on a chair. 2. Hold the incentive spirometer in an upright position. 3. Breathe out normally. 4. Place the mouthpiece in your mouth and seal your lips tightly around it. 5. Breathe in slowly and as deeply as possible, raising the piston or the ball toward the top of the column. 6. Hold your breath for 3-5 seconds or for as long as possible. Allow the piston or ball to fall to the bottom of the column. 7. Remove the mouthpiece from your mouth and breathe out normally. 8. Rest for a few seconds and repeat Steps 1 through 7 at least 10 times every 1-2 hours when you are awake. Take your time and take a few normal breaths between deep breaths. 9. The spirometer may include an indicator to show your best effort. Use the indicator as a goal to work toward during each repetition. 10. After each set of 10 deep breaths, practice coughing to be sure your lungs are clear. If you have an incision (the cut made at the time of surgery), support your incision when coughing by placing a pillow or rolled up towels firmly against it. Once you are able to get out of bed, walk around indoors and cough well. You may stop using the incentive spirometer when instructed by your caregiver.  RISKS AND COMPLICATIONS  Take your time so you do not get  dizzy or light-headed.  If you are in pain, you may need to take or ask for pain medication before doing incentive spirometry. It is harder to take a deep breath if you are having  pain. AFTER USE  Rest and breathe slowly and easily.  It can be helpful to keep track of a log of your progress. Your caregiver can provide you with a simple table to help with this. If you are using the spirometer at home, follow these instructions: [place] IF:   You are having difficultly using the spirometer.  You have trouble using the spirometer as often as instructed.  Your pain medication is not giving enough relief while using the spirometer.  You develop fever of 100.5 F (38.1 C) or higher. SEEK IMMEDIATE MEDICAL CARE IF:   You cough up bloody sputum that had not been present before.  You develop fever of 102 F (38.9 C) or greater.  You develop worsening pain at or near the incision site. MAKE SURE YOU:   Understand these instructions.  Will watch your condition.  Will get help right away if you are not doing well or get worse. Document Released: 12/05/2006 Document Revised: 10/17/2011 Document Reviewed: 02/05/2007 [facility] Patient Information 2014 [place], [place].   ________________________________________________________________________

## 2018-05-17 ENCOUNTER — Encounter (HOSPITAL_COMMUNITY): Payer: Self-pay

## 2018-05-17 ENCOUNTER — Encounter (HOSPITAL_COMMUNITY)
Admission: RE | Admit: 2018-05-17 | Discharge: 2018-05-17 | Disposition: A | Discharge status: Home / Self Care | Payer: Medicare Other | Source: Ambulatory Visit | Attending: Orthopedic Surgery | Admitting: Orthopedic Surgery

## 2018-05-17 ENCOUNTER — Other Ambulatory Visit: Payer: Self-pay

## 2018-05-17 DIAGNOSIS — I10 Essential (primary) hypertension: Secondary | ICD-10-CM | POA: Insufficient documentation

## 2018-05-17 DIAGNOSIS — Z01818 Encounter for other preprocedural examination: Secondary | ICD-10-CM | POA: Insufficient documentation

## 2018-05-17 HISTORY — DX: Basal cell carcinoma of skin, unspecified: C44.91

## 2018-05-17 HISTORY — DX: Essential (primary) hypertension: I10

## 2018-05-17 HISTORY — DX: Anxiety disorder, unspecified: F41.9

## 2018-05-17 HISTORY — DX: Pure hypercholesterolemia, unspecified: E78.00

## 2018-05-17 HISTORY — DX: Bursopathy, unspecified: M71.9

## 2018-05-17 LAB — BASIC METABOLIC PANEL
Anion gap: 9 (ref 5–15)
BUN: 23 mg/dL (ref 8–23)
CALCIUM: 9.6 mg/dL (ref 8.9–10.3)
CHLORIDE: 104 mmol/L (ref 98–111)
CO2: 26 mmol/L (ref 22–32)
CREATININE: 0.64 mg/dL (ref 0.44–1.00)
GFR calc non Af Amer: >60 mL/min (ref >60)
GLUCOSE: 101 mg/dL — AB (ref 70–99)
Potassium: 4 mmol/L (ref 3.5–5.1)
Sodium: 139 mmol/L (ref 135–145)

## 2018-05-17 LAB — CBC
HEMATOCRIT: 39.2 % (ref 36.0–46.0)
Hemoglobin: 13.1 g/dL (ref 12.0–15.0)
MCH: 31.4 pg (ref 26.0–34.0)
MCHC: 33.4 g/dL (ref 30.0–36.0)
MCV: 94 fL (ref 80.0–100.0)
NRBC: 0 % (ref 0.0–0.2)
Platelets: 210 10*3/uL (ref 150–400)
RBC: 4.17 MIL/uL (ref 3.87–5.11)
RDW: 13.4 % (ref 11.5–15.5)
WBC: 5.9 10*3/uL (ref 4.0–10.5)

## 2018-05-23 ENCOUNTER — Other Ambulatory Visit: Payer: Self-pay

## 2018-05-23 ENCOUNTER — Encounter (HOSPITAL_COMMUNITY)
Admission: RE | Disposition: A | Discharge status: Home / Self Care | Payer: Self-pay | Source: Ambulatory Visit | Attending: Orthopedic Surgery

## 2018-05-23 ENCOUNTER — Ambulatory Visit (HOSPITAL_COMMUNITY): Payer: Medicare Other | Admitting: Certified Registered Nurse Anesthetist

## 2018-05-23 ENCOUNTER — Encounter (HOSPITAL_COMMUNITY): Payer: Self-pay | Admitting: Certified Registered Nurse Anesthetist

## 2018-05-23 ENCOUNTER — Observation Stay (HOSPITAL_COMMUNITY)
Admission: RE | Admit: 2018-05-23 | Discharge: 2018-05-24 | Disposition: A | Discharge status: Home / Self Care | Payer: Medicare Other | Source: Ambulatory Visit | Attending: Orthopedic Surgery | Admitting: Orthopedic Surgery

## 2018-05-23 DIAGNOSIS — E7800 Pure hypercholesterolemia, unspecified: Secondary | ICD-10-CM | POA: Diagnosis not present

## 2018-05-23 DIAGNOSIS — I10 Essential (primary) hypertension: Secondary | ICD-10-CM | POA: Diagnosis not present

## 2018-05-23 DIAGNOSIS — Z881 Allergy status to other antibiotic agents status: Secondary | ICD-10-CM | POA: Insufficient documentation

## 2018-05-23 DIAGNOSIS — Z85828 Personal history of other malignant neoplasm of skin: Secondary | ICD-10-CM | POA: Insufficient documentation

## 2018-05-23 DIAGNOSIS — F419 Anxiety disorder, unspecified: Secondary | ICD-10-CM | POA: Insufficient documentation

## 2018-05-23 DIAGNOSIS — Z882 Allergy status to sulfonamides status: Secondary | ICD-10-CM | POA: Insufficient documentation

## 2018-05-23 DIAGNOSIS — Z880 Allergy status to penicillin: Secondary | ICD-10-CM | POA: Insufficient documentation

## 2018-05-23 DIAGNOSIS — M7071 Other bursitis of hip, right hip: Secondary | ICD-10-CM | POA: Diagnosis not present

## 2018-05-23 DIAGNOSIS — Z79899 Other long term (current) drug therapy: Secondary | ICD-10-CM | POA: Insufficient documentation

## 2018-05-23 DIAGNOSIS — M7061 Trochanteric bursitis, right hip: Principal | ICD-10-CM | POA: Insufficient documentation

## 2018-05-23 DIAGNOSIS — X58XXXA Exposure to other specified factors, initial encounter: Secondary | ICD-10-CM | POA: Diagnosis not present

## 2018-05-23 DIAGNOSIS — Z87891 Personal history of nicotine dependence: Secondary | ICD-10-CM | POA: Insufficient documentation

## 2018-05-23 DIAGNOSIS — S76311A Strain of muscle, fascia and tendon of the posterior muscle group at thigh level, right thigh, initial encounter: Secondary | ICD-10-CM | POA: Insufficient documentation

## 2018-05-23 DIAGNOSIS — S76011A Strain of muscle, fascia and tendon of right hip, initial encounter: Secondary | ICD-10-CM | POA: Diagnosis not present

## 2018-05-23 HISTORY — PX: OPEN SURGICAL REPAIR OF GLUTEAL TENDON: SHX5995

## 2018-05-23 SURGERY — REPAIR, TENDON, GLUTEUS MEDIUS, OPEN
Anesthesia: General | Site: Hip | Laterality: Right

## 2018-05-23 MED ORDER — SIMVASTATIN 20 MG PO TABS
20.0000 mg | ORAL_TABLET | Freq: Every day | ORAL | Status: DC
  Administered 2018-05-23: 20 mg via ORAL
  Filled 2018-05-23: qty 1

## 2018-05-23 MED ORDER — OXYCODONE HCL 5 MG/5ML PO SOLN: 5.0000 mg | Freq: Once | ORAL | Status: DC | PRN

## 2018-05-23 MED ORDER — HYDROCODONE-ACETAMINOPHEN 5-325 MG PO TABS
1.0000 | ORAL_TABLET | ORAL | Status: DC | PRN
  Administered 2018-05-23 (×2): 1 via ORAL
  Administered 2018-05-24 (×2): 2 via ORAL
  Filled 2018-05-23 (×2): qty 1
  Filled 2018-05-23 (×2): qty 2

## 2018-05-23 MED ORDER — PROPOFOL 10 MG/ML IV BOLUS
INTRAVENOUS | Status: DC | PRN
  Administered 2018-05-23: 180 mg via INTRAVENOUS

## 2018-05-23 MED ORDER — LORATADINE 10 MG PO TABS: 10.0000 mg | ORAL_TABLET | Freq: Every day | ORAL | Status: DC

## 2018-05-23 MED ORDER — FENTANYL CITRATE (PF) 100 MCG/2ML IJ SOLN
25.0000 ug | INTRAMUSCULAR | Status: DC | PRN
  Administered 2018-05-23: 25 ug via INTRAVENOUS
  Administered 2018-05-23: 50 ug via INTRAVENOUS

## 2018-05-23 MED ORDER — 0.9 % SODIUM CHLORIDE (POUR BTL) OPTIME
TOPICAL | Status: DC | PRN
  Administered 2018-05-23: 1000 mL

## 2018-05-23 MED ORDER — METHOCARBAMOL 500 MG IVPB - SIMPLE MED
500.0000 mg | Freq: Four times a day (QID) | INTRAVENOUS | Status: DC | PRN
  Administered 2018-05-23: 500 mg via INTRAVENOUS
  Filled 2018-05-23: qty 50

## 2018-05-23 MED ORDER — POVIDONE-IODINE 10 % EX SWAB: 2.0000 "application " | Freq: Once | CUTANEOUS | Status: DC

## 2018-05-23 MED ORDER — SUGAMMADEX SODIUM 200 MG/2ML IV SOLN
INTRAVENOUS | Status: DC | PRN
  Administered 2018-05-23: 200 mg via INTRAVENOUS

## 2018-05-23 MED ORDER — OXYCODONE HCL 5 MG PO TABS: 5.0000 mg | ORAL_TABLET | Freq: Once | ORAL | Status: DC | PRN

## 2018-05-23 MED ORDER — METOCLOPRAMIDE HCL 5 MG/ML IJ SOLN: 5.0000 mg | Freq: Three times a day (TID) | INTRAMUSCULAR | Status: DC | PRN

## 2018-05-23 MED ORDER — FENTANYL CITRATE (PF) 100 MCG/2ML IJ SOLN
INTRAMUSCULAR | Status: AC
  Filled 2018-05-23: qty 2

## 2018-05-23 MED ORDER — SODIUM CHLORIDE 0.9 % IJ SOLN
INTRAMUSCULAR | Status: AC
  Filled 2018-05-23: qty 50

## 2018-05-23 MED ORDER — DEXAMETHASONE SODIUM PHOSPHATE 10 MG/ML IJ SOLN
INTRAMUSCULAR | Status: AC
  Filled 2018-05-23: qty 1

## 2018-05-23 MED ORDER — CHLORHEXIDINE GLUCONATE 4 % EX LIQD: 60.0000 mL | Freq: Once | CUTANEOUS | Status: DC

## 2018-05-23 MED ORDER — DEXAMETHASONE SODIUM PHOSPHATE 10 MG/ML IJ SOLN
INTRAMUSCULAR | Status: DC | PRN
  Administered 2018-05-23: 10 mg via INTRAVENOUS

## 2018-05-23 MED ORDER — ALPRAZOLAM 0.25 MG PO TABS
0.2500 mg | ORAL_TABLET | Freq: Every day | ORAL | Status: DC | PRN
  Administered 2018-05-24: 0.25 mg via ORAL
  Filled 2018-05-23: qty 1

## 2018-05-23 MED ORDER — PROPOFOL 500 MG/50ML IV EMUL
INTRAVENOUS | Status: DC | PRN
  Administered 2018-05-23: 100 ug/kg/min via INTRAVENOUS

## 2018-05-23 MED ORDER — ROCURONIUM BROMIDE 10 MG/ML (PF) SYRINGE
PREFILLED_SYRINGE | INTRAVENOUS | Status: AC
  Filled 2018-05-23: qty 10

## 2018-05-23 MED ORDER — SUGAMMADEX SODIUM 200 MG/2ML IV SOLN
INTRAVENOUS | Status: AC
  Filled 2018-05-23: qty 2

## 2018-05-23 MED ORDER — IRBESARTAN 150 MG PO TABS: 300.0000 mg | ORAL_TABLET | Freq: Every day | ORAL | Status: DC

## 2018-05-23 MED ORDER — SODIUM CHLORIDE 0.9 % IV SOLN
INTRAVENOUS | Status: DC
  Administered 2018-05-23: 15:00:00 via INTRAVENOUS

## 2018-05-23 MED ORDER — EPHEDRINE SULFATE-NACL 50-0.9 MG/10ML-% IV SOSY
PREFILLED_SYRINGE | INTRAVENOUS | Status: DC | PRN
  Administered 2018-05-23: 10 mg via INTRAVENOUS

## 2018-05-23 MED ORDER — MORPHINE SULFATE (PF) 2 MG/ML IV SOLN: 1.0000 mg | INTRAVENOUS | Status: DC | PRN

## 2018-05-23 MED ORDER — ONDANSETRON HCL 4 MG/2ML IJ SOLN
4.0000 mg | Freq: Once | INTRAMUSCULAR | Status: AC | PRN
  Administered 2018-05-23: 4 mg via INTRAVENOUS

## 2018-05-23 MED ORDER — ONDANSETRON HCL 4 MG/2ML IJ SOLN: 4.0000 mg | Freq: Four times a day (QID) | INTRAMUSCULAR | Status: DC | PRN

## 2018-05-23 MED ORDER — LACTATED RINGERS IV SOLN
INTRAVENOUS | Status: DC
  Administered 2018-05-23 (×2): via INTRAVENOUS

## 2018-05-23 MED ORDER — ACETAMINOPHEN 500 MG PO TABS: 500.0000 mg | ORAL_TABLET | Freq: Four times a day (QID) | ORAL | Status: DC

## 2018-05-23 MED ORDER — EPHEDRINE 5 MG/ML INJ
INTRAVENOUS | Status: AC
  Filled 2018-05-23: qty 10

## 2018-05-23 MED ORDER — PHENYLEPHRINE 40 MCG/ML (10ML) SYRINGE FOR IV PUSH (FOR BLOOD PRESSURE SUPPORT)
PREFILLED_SYRINGE | INTRAVENOUS | Status: AC
  Filled 2018-05-23: qty 10

## 2018-05-23 MED ORDER — AMLODIPINE BESYLATE 5 MG PO TABS: 5.0000 mg | ORAL_TABLET | Freq: Every day | ORAL | Status: DC

## 2018-05-23 MED ORDER — ROCURONIUM BROMIDE 10 MG/ML (PF) SYRINGE
PREFILLED_SYRINGE | INTRAVENOUS | Status: DC | PRN
  Administered 2018-05-23: 40 mg via INTRAVENOUS

## 2018-05-23 MED ORDER — MIDAZOLAM HCL 2 MG/2ML IJ SOLN
INTRAMUSCULAR | Status: AC
  Filled 2018-05-23: qty 2

## 2018-05-23 MED ORDER — ONDANSETRON HCL 4 MG/2ML IJ SOLN
INTRAMUSCULAR | Status: AC
  Filled 2018-05-23: qty 2

## 2018-05-23 MED ORDER — FENTANYL CITRATE (PF) 250 MCG/5ML IJ SOLN
INTRAMUSCULAR | Status: AC
  Filled 2018-05-23: qty 5

## 2018-05-23 MED ORDER — DOCUSATE SODIUM 100 MG PO CAPS
100.0000 mg | ORAL_CAPSULE | Freq: Two times a day (BID) | ORAL | Status: DC
  Administered 2018-05-23: 100 mg via ORAL
  Filled 2018-05-23: qty 1

## 2018-05-23 MED ORDER — SODIUM CHLORIDE 0.9 % IJ SOLN
INTRAMUSCULAR | Status: DC | PRN
  Administered 2018-05-23: 30 mL

## 2018-05-23 MED ORDER — PHENYLEPHRINE 40 MCG/ML (10ML) SYRINGE FOR IV PUSH (FOR BLOOD PRESSURE SUPPORT)
PREFILLED_SYRINGE | INTRAVENOUS | Status: DC | PRN
  Administered 2018-05-23 (×5): 80 ug via INTRAVENOUS

## 2018-05-23 MED ORDER — LIDOCAINE 2% (20 MG/ML) 5 ML SYRINGE
INTRAMUSCULAR | Status: DC | PRN
  Administered 2018-05-23: 60 mg via INTRAVENOUS

## 2018-05-23 MED ORDER — FENTANYL CITRATE (PF) 100 MCG/2ML IJ SOLN
INTRAMUSCULAR | Status: AC
  Administered 2018-05-23: 50 ug via INTRAVENOUS
  Filled 2018-05-23: qty 2

## 2018-05-23 MED ORDER — VANCOMYCIN HCL IN DEXTROSE 1-5 GM/200ML-% IV SOLN
1000.0000 mg | INTRAVENOUS | Status: AC
  Administered 2018-05-23: 1000 mg via INTRAVENOUS
  Filled 2018-05-23: qty 200

## 2018-05-23 MED ORDER — ENOXAPARIN SODIUM 40 MG/0.4ML ~~LOC~~ SOLN
40.0000 mg | SUBCUTANEOUS | Status: DC
  Administered 2018-05-24: 40 mg via SUBCUTANEOUS
  Filled 2018-05-23: qty 0.4

## 2018-05-23 MED ORDER — PROPOFOL 10 MG/ML IV BOLUS
INTRAVENOUS | Status: AC
  Filled 2018-05-23: qty 20

## 2018-05-23 MED ORDER — ONDANSETRON HCL 4 MG/2ML IJ SOLN
INTRAMUSCULAR | Status: DC | PRN
  Administered 2018-05-23: 4 mg via INTRAVENOUS

## 2018-05-23 MED ORDER — PROPOFOL 10 MG/ML IV BOLUS
INTRAVENOUS | Status: AC
  Filled 2018-05-23: qty 40

## 2018-05-23 MED ORDER — ONDANSETRON HCL 4 MG PO TABS: 4.0000 mg | ORAL_TABLET | Freq: Four times a day (QID) | ORAL | Status: DC | PRN

## 2018-05-23 MED ORDER — ACETAMINOPHEN 10 MG/ML IV SOLN
1000.0000 mg | Freq: Four times a day (QID) | INTRAVENOUS | Status: DC
  Administered 2018-05-23: 1000 mg via INTRAVENOUS
  Filled 2018-05-23: qty 100

## 2018-05-23 MED ORDER — LIDOCAINE 2% (20 MG/ML) 5 ML SYRINGE
INTRAMUSCULAR | Status: AC
  Filled 2018-05-23: qty 5

## 2018-05-23 MED ORDER — BUPIVACAINE HCL 0.25 % IJ SOLN
INTRAMUSCULAR | Status: AC
  Filled 2018-05-23: qty 1

## 2018-05-23 MED ORDER — BUPIVACAINE LIPOSOME 1.3 % IJ SUSP
20.0000 mL | Freq: Once | INTRAMUSCULAR | Status: AC
  Administered 2018-05-23: 20 mL
  Filled 2018-05-23: qty 20

## 2018-05-23 MED ORDER — METHOCARBAMOL 500 MG IVPB - SIMPLE MED
INTRAVENOUS | Status: AC
  Administered 2018-05-23: 500 mg via INTRAVENOUS
  Filled 2018-05-23: qty 50

## 2018-05-23 MED ORDER — METHOCARBAMOL 500 MG PO TABS
500.0000 mg | ORAL_TABLET | Freq: Four times a day (QID) | ORAL | Status: DC | PRN
  Administered 2018-05-23 – 2018-05-24 (×3): 500 mg via ORAL
  Filled 2018-05-23 (×3): qty 1

## 2018-05-23 MED ORDER — METOCLOPRAMIDE HCL 5 MG PO TABS: 5.0000 mg | ORAL_TABLET | Freq: Three times a day (TID) | ORAL | Status: DC | PRN

## 2018-05-23 MED ORDER — FENTANYL CITRATE (PF) 250 MCG/5ML IJ SOLN
INTRAMUSCULAR | Status: DC | PRN
  Administered 2018-05-23 (×7): 50 ug via INTRAVENOUS

## 2018-05-23 SURGICAL SUPPLY — 57 items
ANCH SUT 2 CP-2 EBND QANCHR+ (Anchor) ×1 IMPLANT
ANCHOR SUPER QUICK (Anchor) ×2 IMPLANT
BAG SPEC THK2 15X12 ZIP CLS (MISCELLANEOUS) ×1
BAG ZIPLOCK 12X15 (MISCELLANEOUS) ×2 IMPLANT
BIT DRILL 2.8X128 (BIT) ×2 IMPLANT
BIT DRILL 2.8X128MM (BIT) ×1
BLADE EXTENDED COATED 6.5IN (ELECTRODE) ×2 IMPLANT
CLOSURE WOUND 1/2 X4 (GAUZE/BANDAGES/DRESSINGS) ×1
COVER SURGICAL LIGHT HANDLE (MISCELLANEOUS) ×3 IMPLANT
COVER WAND RF STERILE (DRAPES) ×2 IMPLANT
DRAPE INCISE IOBAN 66X45 STRL (DRAPES) ×3 IMPLANT
DRAPE ORTHO SPLIT 77X108 STRL (DRAPES) ×6
DRAPE POUCH INSTRU U-SHP 10X18 (DRAPES) ×3 IMPLANT
DRAPE SURG ORHT 6 SPLT 77X108 (DRAPES) ×2 IMPLANT
DRAPE U-SHAPE 47X51 STRL (DRAPES) ×3 IMPLANT
DRSG ADAPTIC 3X8 NADH LF (GAUZE/BANDAGES/DRESSINGS) ×3 IMPLANT
DRSG MEPILEX BORDER 4X4 (GAUZE/BANDAGES/DRESSINGS) ×2 IMPLANT
DRSG MEPILEX BORDER 4X8 (GAUZE/BANDAGES/DRESSINGS) ×3 IMPLANT
DURAPREP 26ML APPLICATOR (WOUND CARE) ×3 IMPLANT
ELECT BLADE 6.5 .24CM SHAFT (ELECTRODE) ×2 IMPLANT
ELECT CAUTERY BLADE 6.4 (BLADE) ×2 IMPLANT
ELECT REM PT RETURN 15FT ADLT (MISCELLANEOUS) ×3 IMPLANT
EVACUATOR 1/8 PVC DRAIN (DRAIN) ×2 IMPLANT
FACESHIELD WRAPAROUND (MASK) ×12 IMPLANT
FACESHIELD WRAPAROUND OR TEAM (MASK) ×2 IMPLANT
GAUZE SPONGE 4X4 12PLY STRL (GAUZE/BANDAGES/DRESSINGS) ×3 IMPLANT
GLOVE BIO SURGEON STRL SZ7 (GLOVE) ×3 IMPLANT
GLOVE BIO SURGEON STRL SZ7.5 (GLOVE) ×2 IMPLANT
GLOVE BIO SURGEON STRL SZ8 (GLOVE) ×3 IMPLANT
GLOVE BIOGEL PI IND STRL 7.0 (GLOVE) ×1 IMPLANT
GLOVE BIOGEL PI IND STRL 7.5 (GLOVE) IMPLANT
GLOVE BIOGEL PI IND STRL 8 (GLOVE) ×2 IMPLANT
GLOVE BIOGEL PI INDICATOR 7.0 (GLOVE) ×4
GLOVE BIOGEL PI INDICATOR 7.5 (GLOVE) ×2
GLOVE BIOGEL PI INDICATOR 8 (GLOVE) ×6
GOWN STRL REUS W/TWL LRG LVL3 (GOWN DISPOSABLE) ×5 IMPLANT
GOWN STRL REUS W/TWL XL LVL3 (GOWN DISPOSABLE) ×5 IMPLANT
KIT BASIN OR (CUSTOM PROCEDURE TRAY) ×3 IMPLANT
MANIFOLD NEPTUNE II (INSTRUMENTS) ×3 IMPLANT
NDL MA TROC 1/2 (NEEDLE) ×1 IMPLANT
NDL SAFETY ECLIPSE 18X1.5 (NEEDLE) ×2 IMPLANT
NEEDLE HYPO 18GX1.5 SHARP (NEEDLE) ×6
NEEDLE MA TROC 1/2 (NEEDLE) ×3 IMPLANT
PACK TOTAL JOINT (CUSTOM PROCEDURE TRAY) ×3 IMPLANT
PASSER SUT SWANSON 36MM LOOP (INSTRUMENTS) ×3 IMPLANT
POSITIONER SURGICAL ARM (MISCELLANEOUS) ×3 IMPLANT
STRIP CLOSURE SKIN 1/2X4 (GAUZE/BANDAGES/DRESSINGS) ×3 IMPLANT
SUT ETHIBOND NAB CT1 #1 30IN (SUTURE) ×9 IMPLANT
SUT MNCRL AB 4-0 PS2 18 (SUTURE) ×3 IMPLANT
SUT STRATAFIX 0 PDS 27 VIOLET (SUTURE) ×3
SUT VIC AB 1 CT1 36 (SUTURE) ×2 IMPLANT
SUT VIC AB 2-0 CT1 27 (SUTURE) ×6
SUT VIC AB 2-0 CT1 TAPERPNT 27 (SUTURE) ×2 IMPLANT
SUTURE STRATFX 0 PDS 27 VIOLET (SUTURE) ×1 IMPLANT
SYR 30ML LL (SYRINGE) ×6 IMPLANT
TOWEL OR 17X26 10 PK STRL BLUE (TOWEL DISPOSABLE) ×6 IMPLANT
YANKAUER SUCT BULB TIP 10FT TU (MISCELLANEOUS) ×2 IMPLANT

## 2018-05-23 NOTE — H&P (Signed)
CC- Haley [name] is a 67 y.o. female who presents with right hip pain  Hip Pain: Patient complains of right hip pain. Onset of the symptoms was several years ago. Inciting event: none. Current symptoms include lateral hip pain. Associated symptoms: none. Aggravating symptoms: going up and down stairs, pivoting, rising after sitting, standing, walking and lying on right side.  Evaluation to date: plain films, which were normal and MRI with attrition and tearing of gluteus medius tendon.  Treatment to date: physical therapy, which has been not very effective and multiple corisone injections with partial temporary relief.  Past Medical History:  Diagnosis Date  . Anxiety   . Basal cell carcinoma    removed from right arm   . Bursitis    right   . Hypercholesteremia   . Hypertension     Past Surgical History:  Procedure Laterality Date  . BREAST EXCISIONAL BIOPSY Right 1970   benign  . BREAST EXCISIONAL BIOPSY Left 2009   benign  . breast tumor   1970  . INCISIONAL BREAST BIOPSY Left   . rotator   05/2007   rigth rotator cuff repair     Prior to Admission medications   Medication Sig Start Date End Date Taking? Authorizing Provider  ALPRAZolam Duanne Moron) 0.25 MG tablet Take 0.25 mg by mouth daily as needed for anxiety.  09/01/16  Yes [provider]  amLODipine (NORVASC) 5 MG tablet Take 5 mg by mouth daily.  08/22/16  Yes [provider]  eszopiclone (LUNESTA) 2 MG TABS tablet Take 2 mg by mouth at bedtime.  06/13/16  Yes [provider]  loratadine (CLARITIN) 10 MG tablet Take 10 mg by mouth daily.   Yes [provider]  simvastatin (ZOCOR) 20 MG tablet Take 20 mg by mouth at bedtime.  09/01/16  Yes [provider]  triamcinolone cream (KENALOG) 0.1 % Apply 1 application topically 2 (two) times daily.   Yes [provider]  valsartan (DIOVAN) 320 MG tablet Take 320 mg by mouth daily. 08/22/16  Yes [provider]  Vitamin D,  Ergocalciferol, (DRISDOL) 50000 units CAPS capsule Take 50,000 capsules by mouth every Sunday.  08/22/16  Yes [provider]  spironolactone (ALDACTONE) 25 MG tablet Take 25 mg by mouth daily. 10/28/16   [provider]    Physical Examination: General appearance - alert, well appearing, and in no distress Mental status - alert, oriented to person, place, and time Chest - clear to auscultation, no wheezes, rales or rhonchi, symmetric air entry Heart - normal rate, regular rhythm, normal S1, S2, no murmurs, rubs, clicks or gallops Abdomen - soft, nontender, nondistended, no masses or organomegaly Neurological - alert, oriented, normal speech, no focal findings or movement disorder noted  A right hip exam was performed. GENERAL: no acute distress SKIN: intact SWELLING: none WARMTH: no warmth TENDERNESS: maximal at greater trochanter ROM: normal STRENGTH: weakness with hip abduction GAIT: antalgic  ASSESSMENT: Right hip intractable bursitis with gluteal tendon tear  Plan Right hip bursectomy and gluteal tendon repair. Discussed in detail with patient who elects to proceed  Haley [name]. Lionell Matuszak, MD    05/23/2018, 8:30 AM

## 2018-05-23 NOTE — Transfer of Care (Signed)
Immediate Anesthesia Transfer of Care Note  Patient: Haley [name]  Procedure(s) Performed: right hip bursectomy; gluteal tendon repair (Right Hip)  Patient Location: PACU  Anesthesia Type:General  Level of Consciousness: awake, alert , oriented and patient cooperative  Airway & Oxygen Therapy: Patient Spontanous Breathing and Patient connected to face mask oxygen  Post-op Assessment: Report given to RN, Post -op Vital signs reviewed and stable and Patient moving all extremities  Post vital signs: Reviewed and stable  Last Vitals:  Vitals Value Taken Time  BP 115/66 05/23/2018 11:33 AM  Temp    Pulse 76 05/23/2018 11:37 AM  Resp 12 05/23/2018 11:37 AM  SpO2 94 % 05/23/2018 11:37 AM  Vitals shown include unvalidated device data.  Last Pain:  Vitals:   05/23/18 0845  TempSrc:   PainSc: 0-No pain      Patients Stated Pain Goal: 4 (92/33/00 7622)  Complications: No apparent anesthesia complications

## 2018-05-23 NOTE — Discharge Instructions (Addendum)
Dr. Gaynelle Arabian Total [facility] Specialist [facility] [address] [place], [place] [address] [phone]  Bursectomy / Gluteal Tendon Repair Discharge Instructions BLOOD CLOT PREVENTION Take one 81 mg aspirin once a day for four weeks following surgery. Then discontinue aspirin.  HOME CARE INSTRUCTIONS  Remove items at home which could result in a fall. This includes throw rugs or furniture in walking pathways.   ICE to the affected hip every three hours for 30 minutes at a time and then as needed for pain and swelling.  Continue to use ice on the hip for pain and swelling from surgery. You may notice swelling that will progress down to the foot and ankle.  This is normal after surgery.  Elevate the leg when you are not up walking on it.    Continue to use the breathing machine which will help keep your temperature down.  It is common for your temperature to cycle up and down following surgery, especially at night when you are not up moving around and exerting yourself.  The breathing machine keeps your lungs expanded and your temperature down. Sit on high chairs which makes it easier to stand.  Sit on chairs with arms. Use the chair arms to help push yourself up when arising.   No Active Abduction of the leg (No pulling leg out to the side away from the body).  Use your walker for first several days until comfortable ambulating.  DIET You may resume your previous home diet once your are discharged from the hospital.  DRESSING / WOUND CARE / SHOWERING You may shower 3 days after surgery, but keep the wounds dry during showering.  You may use an occlusive plastic wrap (Press'n Seal for example), NO SOAKING/SUBMERGING IN THE BATHTUB.  If the bandage gets wet, change with a clean dry gauze.  If the incision gets wet, pat the wound dry with a clean towel. You may start showering three days following surgery but do not submerge the incision under water.Just pat the  incision dry and apply a dry gauze dressing on daily. Change the surgical dressing daily and reapply a dry dressing each time.  ACTIVITY Walk with your walker as instructed. Use walker as long as suggested by your caregivers. Avoid periods of inactivity such as sitting longer than an hour when not asleep. This helps prevent blood clots.  You may resume a sexual relationship in one month or when given the OK by your doctor.  You may return to work once you are cleared by your doctor.  Do not drive a car for 6 weeks or until released by you surgeon.  Do not drive while taking narcotics.  WEIGHT BEARING Weight bearing as tolerated with assist device (walker, cane, etc) as directed, use it as long as suggested by your surgeon or therapist, typically at least 4-6 weeks.  POSTOPERATIVE CONSTIPATION PROTOCOL Constipation - defined medically as fewer than three stools per week and severe constipation as less than one stool per week.  One of the most common issues patients have following surgery is constipation.  Even if you have a regular bowel pattern at home, your normal regimen is likely to be disrupted due to multiple reasons following surgery.  Combination of anesthesia, postoperative narcotics, change in appetite and fluid intake all can affect your bowels.  In order to avoid complications following surgery, here are some recommendations in order to help you during your recovery period.  Colace (docusate) - Pick up an  over-the-counter form of Colace or another stool softener and take twice a day as long as you are requiring postoperative pain medications.  Take with a full glass of water daily.  If you experience loose stools or diarrhea, hold the colace until you stool forms back up.  If your symptoms do not get better within 1 week or if they get worse, check with your doctor.  Dulcolax (bisacodyl) - Pick up over-the-counter and take as directed by the product packaging as needed to assist with  the movement of your bowels.  Take with a full glass of water.  Use this product as needed if not relieved by Colace only.   MiraLax (polyethylene glycol) - Pick up over-the-counter to have on hand.  MiraLax is a solution that will increase the amount of water in your bowels to assist with bowel movements.  Take as directed and can mix with a glass of water, juice, soda, coffee, or tea.  Take if you go more than two days without a movement. Do not use MiraLax more than once per day. Call your doctor if you are still constipated or irregular after using this medication for 7 days in a row.  If you continue to have problems with postoperative constipation, please contact the office for further assistance and recommendations.  If you experience "the worst abdominal pain ever" or develop nausea or vomiting, please contact the office immediatly for further recommendations for treatment.  ITCHING  If you experience itching with your medications, try taking only a single pain pill, or even half a pain pill at a time.  You can also use Benadryl over the counter for itching or also to help with sleep.   TED HOSE STOCKINGS Wear the elastic stockings on both legs for three weeks following surgery during the day but you may remove then at night for sleeping.  MEDICATIONS See your medication summary on the After Visit Summary that the nursing staff will review with you prior to discharge.  You may have some home medications which will be placed on hold until you complete the course of blood thinner medication.  It is important for you to complete the blood thinner medication as prescribed by your surgeon.  Continue your approved medications as instructed at time of discharge.  PRECAUTIONS If you experience chest pain or shortness of breath - call 911 immediately for transfer to the hospital emergency department.  If you develop a fever greater that 101 F, purulent drainage from wound, increased redness or  drainage from wound, foul odor from the wound/dressing, or calf pain - CONTACT YOUR SURGEON.                                                   FOLLOW-UP APPOINTMENTS Make sure you keep all of your appointments after your operation with your surgeon and caregivers. You should call the office at the above phone number and make an appointment for approximately two weeks after the date of your surgery or on the date instructed by your surgeon outlined in the "After Visit Summary".   Pick up stool softner and laxative for home use following surgery while on pain medications. Do not submerge incision under water. Please use good hand washing techniques while changing dressing each day. May shower starting three days after surgery. Please use a clean towel to  pat the incision dry following showers. Continue to use ice for pain and swelling after surgery. Do not use any lotions or creams on the incision until instructed by your surgeon.

## 2018-05-23 NOTE — Anesthesia Preprocedure Evaluation (Addendum)
Anesthesia Evaluation  Patient identified by MRN, date of birth, ID band Patient awake    Reviewed: Allergy & Precautions, NPO status , Patient's Chart, lab work & pertinent test results  History of Anesthesia Complications Negative for: history of anesthetic complications  Airway Mallampati: I  TM Distance: >3 FB Neck ROM: Full    Dental  (+) Dental Advisory Given, Teeth Intact   Pulmonary former smoker,    breath sounds clear to auscultation       Cardiovascular hypertension, Pt. on medications  Rhythm:Regular Rate:Normal     Neuro/Psych PSYCHIATRIC DISORDERS Anxiety negative neurological ROS     GI/Hepatic negative GI ROS, Neg liver ROS,   Endo/Other  negative endocrine ROS  Renal/GU negative Renal ROS     Musculoskeletal negative musculoskeletal ROS (+)   Abdominal   Peds  Hematology negative hematology ROS (+)   Anesthesia Other Findings   Reproductive/Obstetrics                            Anesthesia Physical Anesthesia Plan  ASA: II  Anesthesia Plan: General   Post-op Pain Management:    Induction: Intravenous  PONV Risk Score and Plan: 4 or greater and Treatment may vary due to age or medical condition, Ondansetron, Dexamethasone and Propofol infusion  Airway Management Planned: Oral ETT  Additional Equipment: None  Intra-op Plan:   Post-operative Plan: Extubation in OR  Informed Consent: I have reviewed the patients History and Physical, chart, labs and discussed the procedure including the risks, benefits and alternatives for the proposed anesthesia with the patient or authorized representative who has indicated his/her understanding and acceptance.   Dental advisory given  Plan Discussed with: CRNA and Anesthesiologist  Anesthesia Plan Comments:       Anesthesia Quick Evaluation

## 2018-05-23 NOTE — Progress Notes (Signed)
I checked on Haley [name] and she was having considerable pain especially upon getting up to try and use the bathroom. I feel that she is not ready to discharge home this afternoon due tot the uncontrolled pain. She will stay overnight and discharge home in the morning as long as she has improved

## 2018-05-23 NOTE — Op Note (Signed)
NAMEVANGIE, Haley [name] MEDICAL RECORD [id number] ACCOUNT [id number] DATE OF BIRTH:[birth year] FACILITY: [facility] LOCATION: [facility] PHYSICIAN:Brix Brearley Zella Ball, MD  OPERATIVE REPORT  DATE OF PROCEDURE:  05/23/2018  PREOPERATIVE DIAGNOSES:  Right hip intractable bursitis with gluteal tendon tear.  POSTOPERATIVE DIAGNOSES:  Right hip intractable bursitis with gluteal tendon tear.  PROCEDURE:  Right hip bursectomy was gluteal tendon repair.  SURGEON:  Gaynelle Arabian, MD  ASSISTANT:  Theresa Duty, PA-C  ANESTHESIA:  General.  ESTIMATED BLOOD LOSS:  50.  DRAINS:  Hemovac x1.  COMPLICATIONS:  None.  CONDITION:  Stable to recovery.  BRIEF CLINICAL NOTE:  The patient is a 67 year old female with several year history of progressively worsening right lateral hip pain.  She has had multiple previous injections and attempts at physical therapy with a very short-term benefit.  She has had  progressively worsening pain and dysfunction.  A recent MRI showed gluteal tendon tears.  She presents now for bursectomy and gluteal tendon repair.  DESCRIPTION OF PROCEDURE:  After successful administration of general anesthetic, the patient was placed in the left lateral decubitus position with the right side up and held with a hip positioner.  Right lower extremity was isolated from her perineum  with plastic drapes and prepped and draped in the usual sterile fashion.  Short posterolateral incision was made with a 10 blade through subcutaneous tissue to the level of fascia lata, which was incised in line with the skin incision.  She had a large  hypertrophic bursa with fluid in the bursa.  I excised the bursa with electrocautery and obtained excellent hemostasis.  It was noted that there was a tear at the superior border of the greater trochanter, which the gluteus medius has retracted off of  the greater trochanter and also there is a split within the fibers of the gluteus medius going vertically.  It is about a 1 cm tear.  I roughened the edges of the bone with a rongeur and then placed a Mitek suture anchor.  The sutures were then passed  through the free edge of the tendon and the tendon is advanced down onto the greater trochanter and oversewn to provide a very stable repair.  I then used further Ethibond suture to suture the edges of the vertical split and the tendon together.  I was  very satisfied with the repair.  We then thoroughly irrigated the wound with saline solution and closed the fascia lata with interrupted #1 Vicryl.  I removed a small triangle of tissue over the tip of the trochanter so it would not rub and cause any  friction.  Prior to closure, we placed a total of 20 mL of Exparel mixed with 30 mL of saline into the gluteal muscles, fascia lata, subcutaneous tissues.  After the fascia was closed and the subcutaneous was closed in multiple layers with interrupted  2-0 Vicryl and subcuticular running 4-0 Monocryl.  A Hemovac drain had been placed deep to the fascia.  The incision was cleaned and dried and Steri-Strips and a bulky sterile dressing applied.    She was then awakened and transported to recovery in stable condition.  AN/NUANCE  D:05/23/2018 T:05/23/2018 JOB:[id number]

## 2018-05-23 NOTE — Brief Op Note (Signed)
05/23/2018  11:09 AM  PATIENT:  Elora Wolter Violett  67 y.o. female  PRE-OPERATIVE DIAGNOSIS:  Right hip bursitis; gluteal tendon tear  POST-OPERATIVE DIAGNOSIS:  Right hip bursitis; gluteal tendon tear  PROCEDURE:  Procedure(s) with comments: right hip bursectomy; gluteal tendon repair (Right) - 39min  SURGEON:  Surgeon(s) and Role:    Gaynelle Arabian, MD - Primary  PHYSICIAN ASSISTANT:   ASSISTANTS: Theresa Duty, PA-C   ANESTHESIA:   general  EBL:  50 mL   BLOOD ADMINISTERED:none  DRAINS: (Medium) Hemovact drain(s) in the right hip with  Suction Open   LOCAL MEDICATIONS USED:  OTHER Exparel  COUNTS:  YES  TOURNIQUET:  * No tourniquets in log *  DICTATION: .Other Dictation: Dictation Number [id number]  PLAN OF CARE: Admit for overnight observation  PATIENT DISPOSITION:  PACU - hemodynamically stable.

## 2018-05-23 NOTE — Anesthesia Procedure Notes (Signed)
Procedure Name: Intubation Date/Time: 05/23/2018 10:12 AM Performed by: Mitzie Na, CRNA Pre-anesthesia Checklist: Patient identified, Emergency Drugs available, Suction available, Patient being monitored and Timeout performed Patient Re-evaluated:Patient Re-evaluated prior to induction Oxygen Delivery Method: Circle system utilized Preoxygenation: Pre-oxygenation with 100% oxygen Induction Type: IV induction Ventilation: Mask ventilation without difficulty Laryngoscope Size: Mac and 3 Grade View: Grade I Tube type: Oral Tube size: 7.0 mm Number of attempts: 1 Airway Equipment and Method: Stylet Placement Confirmation: ETT inserted through vocal cords under direct vision,  breath sounds checked- equal and bilateral and positive ETCO2 Secured at: 22 cm Tube secured with: Tape Dental Injury: Teeth and Oropharynx as per pre-operative assessment

## 2018-05-23 NOTE — Anesthesia Postprocedure Evaluation (Signed)
Anesthesia Post Note  Patient: Chenise Mulvihill [place]  Procedure(s) Performed: right hip bursectomy; gluteal tendon repair (Right Hip)     Patient location during evaluation: PACU Anesthesia Type: General Level of consciousness: awake and alert Pain management: pain level controlled Vital Signs Assessment: post-procedure vital signs reviewed and stable Respiratory status: spontaneous breathing, nonlabored ventilation, respiratory function stable and patient connected to nasal cannula oxygen Cardiovascular status: blood pressure returned to baseline and stable Postop Assessment: no apparent nausea or vomiting Anesthetic complications: no    Last Vitals:  Vitals:   05/23/18 1245 05/23/18 1408  BP: 122/68 110/71  Pulse: 74 73  Resp: 15 16  Temp: (!) 36.4 C (!) 36.3 C  SpO2: 100% 100%    Last Pain:  Vitals:   05/23/18 1408  TempSrc: Oral  PainSc:                  Audry Pili

## 2018-05-24 ENCOUNTER — Encounter (HOSPITAL_COMMUNITY): Payer: Self-pay | Admitting: Orthopedic Surgery

## 2018-05-24 DIAGNOSIS — E7800 Pure hypercholesterolemia, unspecified: Secondary | ICD-10-CM | POA: Diagnosis not present

## 2018-05-24 DIAGNOSIS — I10 Essential (primary) hypertension: Secondary | ICD-10-CM | POA: Diagnosis not present

## 2018-05-24 DIAGNOSIS — F419 Anxiety disorder, unspecified: Secondary | ICD-10-CM | POA: Diagnosis not present

## 2018-05-24 DIAGNOSIS — M7061 Trochanteric bursitis, right hip: Secondary | ICD-10-CM | POA: Diagnosis not present

## 2018-05-24 DIAGNOSIS — Z85828 Personal history of other malignant neoplasm of skin: Secondary | ICD-10-CM | POA: Diagnosis not present

## 2018-05-24 DIAGNOSIS — S76311A Strain of muscle, fascia and tendon of the posterior muscle group at thigh level, right thigh, initial encounter: Secondary | ICD-10-CM | POA: Diagnosis not present

## 2018-05-24 MED ORDER — HYDROCODONE-ACETAMINOPHEN 5-325 MG PO TABS: 1.0000 | ORAL_TABLET | Freq: Four times a day (QID) | ORAL | 0 refills | Status: DC | PRN

## 2018-05-24 MED ORDER — METHOCARBAMOL 500 MG PO TABS: 500.0000 mg | ORAL_TABLET | Freq: Four times a day (QID) | ORAL | 0 refills | Status: DC | PRN

## 2018-05-24 NOTE — Progress Notes (Signed)
   Subjective: 1 Day Post-Op Procedure(s) (LRB): right hip bursectomy; gluteal tendon repair (Right) Patient reports pain as mild.   Patient seen in rounds with Dr. Wynelle Link. Patient is well, and has had no acute complaints or problems. Pain in the right hip is much improved this AM. States she is ready to go home. Voiding without difficulty. Denies chest pain or SOB.  Objective: Vital signs in last 24 hours: Temp:  [97.3 F (36.3 C)-98.6 F (37 C)] 98.5 F (36.9 C) (10/17 0548) Pulse Rate:  [68-86] 80 (10/17 0548) Resp:  [12-17] 16 (10/17 0548) BP: (105-137)/(60-85) 110/63 (10/17 0548) SpO2:  [97 %-100 %] 100 % (10/17 0548) Weight:  [73.9 kg] 73.9 kg (10/16 0845)  Intake/Output from previous day:  Intake/Output Summary (Last 24 hours) at 05/24/2018 0716 Last data filed at 05/24/2018 0600 Gross per 24 hour  Intake 2490 ml  Output 1927.5 ml  Net 562.5 ml    Exam: General - Patient is Alert and Oriented Extremity - Neurologically intact Neurovascular intact Sensation intact distally Dorsiflexion/Plantar flexion intact Dressing - dressing C/D/I Motor Function - intact, moving foot and toes well on exam.   Past Medical History:  Diagnosis Date  . Anxiety   . Basal cell carcinoma    removed from right arm   . Bursitis    right   . Hypercholesteremia   . Hypertension     Assessment/Plan: 1 Day Post-Op Procedure(s) (LRB): right hip bursectomy; gluteal tendon repair (Right) Principal Problem:   Greater trochanteric bursitis of right hip Active Problems:   Trochanteric bursitis, right hip  Estimated body mass index is 24.78 kg/m as calculated from the following:   Height as of this encounter: 5\' 8"  (1.727 m).   Weight as of this encounter: 73.9 kg. Advance diet D/C IV fluids  DVT Prophylaxis - Aspirin and Lovenox Weight bearing as tolerated. Hemovac pulled without difficulty.  Plan is to go Home after hospital stay. Will discharge this AM after lovenox  injection. No active abduction or leading with the right foot going up stairs x 6 weeks. Follow-up in the office in 2 weeks with Dr. Wynelle Link. 81 mg aspirin QD x 4 weeks beginning tomorrow for DVT prophylaxis.   Theresa Duty, PA-C Orthopedic Surgery 05/24/2018, 7:16 AM

## 2018-05-28 NOTE — Discharge Summary (Signed)
Physician Discharge Summary   Patient ID: Haley [name] MRN: [id number] DOB/AGE: 02-28-1951 67 y.o.  Admit date: 05/23/2018 Discharge date: 05/24/2018  Primary Diagnosis: Right hip bursitis; gluteal tendon tear   Admission Diagnoses:  Past Medical History:  Diagnosis Date  . Anxiety   . Basal cell carcinoma    removed from right arm   . Bursitis    right   . Hypercholesteremia   . Hypertension    Discharge Diagnoses:   Principal Problem:   Greater trochanteric bursitis of right hip Active Problems:   Trochanteric bursitis, right hip  Estimated body mass index is 24.78 kg/m as calculated from the following:   Height as of this encounter: _0  (1.727 m).   Weight as of this encounter: 73.9 kg.  Procedure:  Procedure(s) (LRB): right hip bursectomy; gluteal tendon repair (Right)   Consults: None  HPI: The patient is a 67 year old female with several year history of progressively worsening right lateral hip pain. She has had multiple previous injections and attempts at physical therapy with a very short-term benefit. She has had progressively worsening pain and dysfunction.  A recent MRI showed gluteal tendon tears.  She presents now for bursectomy and gluteal tendon repair.  Laboratory Data: Hospital Outpatient Visit on 05/17/2018  Component Date Value Ref Range Status  . Sodium 05/17/2018 139  135 - 145 mmol/L Final  . Potassium 05/17/2018 4.0  3.5 - 5.1 mmol/L Final  . Chloride 05/17/2018 104  98 - 111 mmol/L Final  . CO2 05/17/2018 26  22 - 32 mmol/L Final  . Glucose, Bld 05/17/2018 101* 70 - 99 mg/dL Final  . BUN 05/17/2018 23  8 - 23 mg/dL Final  . Creatinine, Ser 05/17/2018 0.64  0.44 - 1.00 mg/dL Final  . Calcium 05/17/2018 9.6  8.9 - 10.3 mg/dL Final  . GFR calc non Af Amer 05/17/2018 >60  >60 mL/min Final  . GFR calc Af Amer 05/17/2018 >60  >60 mL/min Final   Comment: (NOTE) The eGFR has been calculated using the CKD EPI equation. This calculation has  not been validated in all clinical situations. eGFR's persistently <60 mL/min signify possible Chronic Kidney Disease.   Georgiann Hahn gap 05/17/2018 9  5 - 15 Final   Performed at [facility], [place] [address] [place], [place] [address]  . WBC 05/17/2018 5.9  4.0 - 10.5 K/uL Final  . RBC 05/17/2018 4.17  3.87 - 5.11 MIL/uL Final  . Hemoglobin 05/17/2018 13.1  12.0 - 15.0 g/dL Final  . HCT 05/17/2018 39.2  36.0 - 46.0 % Final  . MCV 05/17/2018 94.0  80.0 - 100.0 fL Final  . MCH 05/17/2018 31.4  26.0 - 34.0 pg Final  . MCHC 05/17/2018 33.4  30.0 - 36.0 g/dL Final  . RDW 05/17/2018 13.4  11.5 - 15.5 % Final  . Platelets 05/17/2018 210  150 - 400 K/uL Final  . nRBC 05/17/2018 0.0  0.0 - 0.2 % Final   Performed at [facility], [place] [address], [place], [place] [address]     X-Rays:No results found.  EKG: Orders placed or performed during the hospital encounter of 05/17/18  . EKG 12 lead  . EKG 12 lead     Hospital Course: Haley [name] is a 67 y.o. who was admitted to [facility]. They were brought to the operating room on 05/23/2018 and underwent Procedure(s): right hip bursectomy; gluteal tendon repair.  Patient tolerated the procedure well and was  later transferred to the recovery room and then to the orthopaedic floor for postoperative care. They were given PO and IV analgesics for pain control following their surgery. They were given 24 hours of postoperative antibiotics of  Anti-infectives (From admission, onward)   Start     Dose/Rate Route Frequency Ordered Stop   05/23/18 0830  vancomycin (VANCOCIN) IVPB 1000 mg/200 mL premix     1,000 mg 200 mL/hr over 60 Minutes Intravenous On call to O.R. 05/23/18 1610 05/23/18 1047     and started on DVT prophylaxis in the form of Aspirin and Lovenox.  Patient had a decent night on the evening of surgery, was having a lot of pain in the right hip a few hours after surgery. Pt was seen during  rounds and was ready to go home, pain was much improved. Hemovac drain was pulled without difficulty.  Pt was discharged to home later that day in stable condition.  Diet: Regular diet Activity: WBAT Follow-up: in 2 weeks with Dr. Wynelle Link Disposition: Home Discharged Condition: stable   Discharge Instructions    Call MD / Call 911   Complete by:  As directed    If you experience chest pain or shortness of breath, CALL 911 and be transported to the hospital emergency room.  If you develope a fever above 101 F, pus (white drainage) or increased drainage or redness at the wound, or calf pain, call your surgeon's office.   Constipation Prevention   Complete by:  As directed    Drink plenty of fluids.  Prune juice may be helpful.  You may use a stool softener, such as Colace (over the counter) 100 mg twice a day.  Use MiraLax (over the counter) for constipation as needed.   Diet general   Complete by:  As directed    Driving restrictions   Complete by:  As directed    No driving for 2 weeks   Weight bearing as tolerated   Complete by:  As directed      Allergies as of 05/24/2018      Reactions   Cephalexin Diarrhea   Potassium Chloride Other (See Comments)   Stomach irritation   Sulfamethoxazole-trimethoprim Other (See Comments)   Stomach irritated   Clindamycin Hives   Penicillin G Rash, Other (See Comments)   Has patient had a PCN reaction causing immediate rash, facial/tongue/throat swelling, SOB or lightheadedness with hypotension: Yes Has patient had a PCN reaction causing severe rash involving mucus membranes or skin necrosis: No Has patient had a PCN reaction that required hospitalization: No Has patient had a PCN reaction occurring within the last 10 years: No If all of the above answers are "NO", then may proceed with Cephalosporin use.      Medication List    TAKE these medications   ALPRAZolam 0.25 MG tablet Commonly known as:  XANAX Take 0.25 mg by mouth daily as  needed for anxiety.   amLODipine 5 MG tablet Commonly known as:  NORVASC Take 5 mg by mouth daily.   eszopiclone 2 MG Tabs tablet Commonly known as:  LUNESTA Take 2 mg by mouth at bedtime.   HYDROcodone-acetaminophen 5-325 MG tablet Commonly known as:  NORCO/VICODIN Take 1-2 tablets by mouth every 6 (six) hours as needed for moderate pain (pain score 4-6).   loratadine 10 MG tablet Commonly known as:  CLARITIN Take 10 mg by mouth daily.   methocarbamol 500 MG tablet Commonly known as:  ROBAXIN Take 1 tablet (500 mg  total) by mouth every 6 (six) hours as needed for muscle spasms.   simvastatin 20 MG tablet Commonly known as:  ZOCOR Take 20 mg by mouth at bedtime.   spironolactone 25 MG tablet Commonly known as:  ALDACTONE Take 25 mg by mouth daily.   triamcinolone cream 0.1 % Commonly known as:  KENALOG Apply 1 application topically 2 (two) times daily.   valsartan 320 MG tablet Commonly known as:  DIOVAN Take 320 mg by mouth daily.   Vitamin D (Ergocalciferol) 50000 units Caps capsule Commonly known as:  DRISDOL Take 50,000 capsules by mouth every _0 /17/19 0719         Follow-up Information    Gaynelle Arabian, MD. Schedule an appointment as soon as possible for a visit on 06/07/2018.   Specialty:  Orthopedic Surgery Contact information: [address] [place] [address] [phone]           Signed: Theresa Duty, PA-C Orthopedic Surgery 05/28/2018, 1:35 PM

## 2018-07-18 DIAGNOSIS — M76829 Posterior tibial tendinitis, unspecified leg: Secondary | ICD-10-CM | POA: Diagnosis not present

## 2018-07-18 DIAGNOSIS — M25559 Pain in unspecified hip: Secondary | ICD-10-CM | POA: Diagnosis not present

## 2018-07-20 DIAGNOSIS — M25551 Pain in right hip: Secondary | ICD-10-CM | POA: Diagnosis not present

## 2018-07-24 DIAGNOSIS — M25551 Pain in right hip: Secondary | ICD-10-CM | POA: Diagnosis not present

## 2018-07-25 ENCOUNTER — Other Ambulatory Visit: Payer: Self-pay | Admitting: Family Medicine

## 2018-07-25 DIAGNOSIS — I10 Essential (primary) hypertension: Secondary | ICD-10-CM | POA: Diagnosis not present

## 2018-07-25 DIAGNOSIS — G4700 Insomnia, unspecified: Secondary | ICD-10-CM | POA: Diagnosis not present

## 2018-07-25 DIAGNOSIS — N644 Mastodynia: Secondary | ICD-10-CM

## 2018-07-25 DIAGNOSIS — E785 Hyperlipidemia, unspecified: Secondary | ICD-10-CM | POA: Diagnosis not present

## 2018-07-25 DIAGNOSIS — F419 Anxiety disorder, unspecified: Secondary | ICD-10-CM | POA: Diagnosis not present

## 2018-07-26 DIAGNOSIS — M25551 Pain in right hip: Secondary | ICD-10-CM | POA: Diagnosis not present

## 2018-07-31 DIAGNOSIS — M76821 Posterior tibial tendinitis, right leg: Secondary | ICD-10-CM | POA: Diagnosis not present

## 2018-07-31 DIAGNOSIS — M25551 Pain in right hip: Secondary | ICD-10-CM | POA: Diagnosis not present

## 2018-08-06 DIAGNOSIS — M25551 Pain in right hip: Secondary | ICD-10-CM | POA: Diagnosis not present

## 2018-08-06 DIAGNOSIS — M76821 Posterior tibial tendinitis, right leg: Secondary | ICD-10-CM | POA: Diagnosis not present

## 2018-08-07 ENCOUNTER — Ambulatory Visit: Payer: Medicare Other

## 2018-08-07 ENCOUNTER — Ambulatory Visit
Admission: RE | Admit: 2018-08-07 | Discharge: 2018-08-07 | Disposition: A | Discharge status: Home / Self Care | Payer: Medicare Other | Source: Ambulatory Visit | Attending: Family Medicine | Admitting: Family Medicine

## 2018-08-07 DIAGNOSIS — N644 Mastodynia: Secondary | ICD-10-CM

## 2018-08-07 DIAGNOSIS — R922 Inconclusive mammogram: Secondary | ICD-10-CM | POA: Diagnosis not present

## 2018-08-13 DIAGNOSIS — M25559 Pain in unspecified hip: Secondary | ICD-10-CM | POA: Diagnosis not present

## 2018-08-13 DIAGNOSIS — M76829 Posterior tibial tendinitis, unspecified leg: Secondary | ICD-10-CM | POA: Diagnosis not present

## 2018-08-17 DIAGNOSIS — M76829 Posterior tibial tendinitis, unspecified leg: Secondary | ICD-10-CM | POA: Diagnosis not present

## 2018-08-17 DIAGNOSIS — M25559 Pain in unspecified hip: Secondary | ICD-10-CM | POA: Diagnosis not present

## 2018-08-22 DIAGNOSIS — M76829 Posterior tibial tendinitis, unspecified leg: Secondary | ICD-10-CM | POA: Diagnosis not present

## 2018-08-22 DIAGNOSIS — M25559 Pain in unspecified hip: Secondary | ICD-10-CM | POA: Diagnosis not present

## 2018-08-24 DIAGNOSIS — M25559 Pain in unspecified hip: Secondary | ICD-10-CM | POA: Diagnosis not present

## 2018-08-24 DIAGNOSIS — M76829 Posterior tibial tendinitis, unspecified leg: Secondary | ICD-10-CM | POA: Diagnosis not present

## 2018-08-27 DIAGNOSIS — M25559 Pain in unspecified hip: Secondary | ICD-10-CM | POA: Diagnosis not present

## 2018-08-27 DIAGNOSIS — M76829 Posterior tibial tendinitis, unspecified leg: Secondary | ICD-10-CM | POA: Diagnosis not present

## 2018-09-05 DIAGNOSIS — M76829 Posterior tibial tendinitis, unspecified leg: Secondary | ICD-10-CM | POA: Diagnosis not present

## 2018-09-05 DIAGNOSIS — M25559 Pain in unspecified hip: Secondary | ICD-10-CM | POA: Diagnosis not present

## 2018-09-10 DIAGNOSIS — M76829 Posterior tibial tendinitis, unspecified leg: Secondary | ICD-10-CM | POA: Diagnosis not present

## 2018-09-10 DIAGNOSIS — M25559 Pain in unspecified hip: Secondary | ICD-10-CM | POA: Diagnosis not present

## 2018-10-03 DIAGNOSIS — L989 Disorder of the skin and subcutaneous tissue, unspecified: Secondary | ICD-10-CM | POA: Diagnosis not present

## 2018-12-27 DIAGNOSIS — M76829 Posterior tibial tendinitis, unspecified leg: Secondary | ICD-10-CM | POA: Diagnosis not present

## 2018-12-27 DIAGNOSIS — M25551 Pain in right hip: Secondary | ICD-10-CM | POA: Diagnosis not present

## 2018-12-27 DIAGNOSIS — M7061 Trochanteric bursitis, right hip: Secondary | ICD-10-CM | POA: Diagnosis not present

## 2019-01-17 ENCOUNTER — Other Ambulatory Visit: Payer: Self-pay | Admitting: Family Medicine

## 2019-01-17 DIAGNOSIS — Z1231 Encounter for screening mammogram for malignant neoplasm of breast: Secondary | ICD-10-CM

## 2019-01-21 ENCOUNTER — Other Ambulatory Visit: Payer: Self-pay | Admitting: Family Medicine

## 2019-01-21 DIAGNOSIS — R222 Localized swelling, mass and lump, trunk: Secondary | ICD-10-CM

## 2019-01-21 DIAGNOSIS — R2230 Localized swelling, mass and lump, unspecified upper limb: Secondary | ICD-10-CM

## 2019-02-28 ENCOUNTER — Other Ambulatory Visit: Payer: Self-pay | Admitting: Family Medicine

## 2019-02-28 ENCOUNTER — Ambulatory Visit
Admission: RE | Admit: 2019-02-28 | Discharge: 2019-02-28 | Disposition: A | Discharge status: Home / Self Care | Payer: Medicare Other | Source: Ambulatory Visit | Attending: Family Medicine | Admitting: Family Medicine

## 2019-02-28 ENCOUNTER — Other Ambulatory Visit: Payer: Self-pay

## 2019-02-28 DIAGNOSIS — R921 Mammographic calcification found on diagnostic imaging of breast: Secondary | ICD-10-CM | POA: Diagnosis not present

## 2019-02-28 DIAGNOSIS — R2230 Localized swelling, mass and lump, unspecified upper limb: Secondary | ICD-10-CM

## 2019-02-28 DIAGNOSIS — R222 Localized swelling, mass and lump, trunk: Secondary | ICD-10-CM

## 2019-02-28 DIAGNOSIS — N6459 Other signs and symptoms in breast: Secondary | ICD-10-CM | POA: Diagnosis not present

## 2019-03-04 ENCOUNTER — Ambulatory Visit
Admission: RE | Admit: 2019-03-04 | Discharge: 2019-03-04 | Disposition: A | Discharge status: Home / Self Care | Payer: Medicare Other | Source: Ambulatory Visit | Attending: Family Medicine | Admitting: Family Medicine

## 2019-03-04 ENCOUNTER — Other Ambulatory Visit: Payer: Self-pay

## 2019-03-04 DIAGNOSIS — R921 Mammographic calcification found on diagnostic imaging of breast: Secondary | ICD-10-CM | POA: Diagnosis not present

## 2019-03-04 DIAGNOSIS — D0502 Lobular carcinoma in situ of left breast: Secondary | ICD-10-CM | POA: Diagnosis not present

## 2019-03-15 ENCOUNTER — Ambulatory Visit: Payer: Self-pay | Admitting: Surgery

## 2019-03-15 DIAGNOSIS — N6320 Unspecified lump in the left breast, unspecified quadrant: Secondary | ICD-10-CM

## 2019-03-15 DIAGNOSIS — D0502 Lobular carcinoma in situ of left breast: Secondary | ICD-10-CM | POA: Diagnosis not present

## 2019-03-15 DIAGNOSIS — L905 Scar conditions and fibrosis of skin: Secondary | ICD-10-CM | POA: Diagnosis not present

## 2019-03-15 NOTE — H&P (Signed)
Haley [name] Documented: 03/15/2019 11:15 AM Location: [facility] Patient #: [id number] DOB: Nov 14, 1950 Married / Language: English / Race: White Female  History of Present Illness Haley Moores A. Emerson Schreifels MD; 03/15/2019 12:41 PM) Patient words: Patient sent at the request of the [facility] [place] for left breast mammographic abnormality. 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 She was noted to have a mammographic density in the left upper quadrant core biopsy consistent with radial scar but LCIS was noted as well specimen. The patient has no complaints today. She does have some swelling and bruising at the operative site and a small hematoma. She is not taking antiestrogen as the past and had a right breast lumpectomy for fibroadenoma.       CLINICAL DATA: 68 year old presenting with a possible palpable lump in the high LEFT axilla. Annual mammographic evaluation. Prior high risk excisional biopsy of the LEFT breast in 2009, pathology LCIS.  EXAM: DIGITAL DIAGNOSTIC BILATERAL MAMMOGRAM WITH CAD AND TOMO  ULTRASOUND LEFT AXILLA  COMPARISON: Previous exam(s).  ACR Breast Density Category c: The breast tissue is heterogeneously dense, which may obscure small masses.  FINDINGS: Tomosynthesis and synthesized full field CC and MLO views of both breasts were obtained. Tomosynthesis and synthesized spot compression tangential view of the area of concern in the high LEFT axilla and standard spot magnification views of LEFT breast calcifications were also obtained.  No mammographic abnormality in the area of palpable concern in the high LEFT axilla. Normal fibrofatty tissue is present in this location and there is no visible pathologic lymphadenopathy.  Spot magnification views confirm a an approximate 10 mm group of coarse heterogeneous calcifications in the UPPER OUTER QUADRANT of the LEFT breast at ANTERIOR depth. These calcifications are  near the scar/architectural distortion related to the prior excisional biopsy, but are not centered in the scar.  No findings suspicious for malignancy RIGHT breast.  Mammographic images were processed with CAD.  On correlative physical exam, there is a prominent LEFT superior axillary fat pad corresponding to the patient concern, though I do not palpate a mass or lymphadenopathy.  Targeted LEFT axillary ultrasound is performed, showing normal appearing lymph nodes. There is no pathologic adenopathy. There is no mass in the area of patient concern the upper axilla.  IMPRESSION: 1. Indeterminate 10 mm group of coarse heterogeneous calcifications in the UPPER OUTER QUADRANT of the LEFT breast at ANTERIOR depth. 2. No pathologic LEFT axillary lymphadenopathy. 3. No mammographic evidence of malignancy involving the RIGHT breast.  RECOMMENDATION: Stereotactic tomosynthesis core needle biopsy of the indeterminate LEFT breast calcifications.  The stereotactic tomosynthesis core needle biopsy procedure was discussed with patient her questions were answered. She has agreed to proceed and the biopsy has been scheduled Monday, July 27 at 10:30 a.m.  I have discussed the findings and recommendations with the patient.  BI-RADS CATEGORY 4: Suspicious.   Electronically Signed By: Haley [name] M.D. 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 Microscopic Comment The LCIS has calcifications and pleomorphic features. Dr. Lyndon [name] has reviewed the case. The case was called to [facility] on 03/05/2019. Haley Males MD Pathologist, Electronic Signature (Case signed 03/05/2019) Specimen Gross and Clinical Information Specimen Comment Time in formalin: 10:55 AM, extracted 3 minutes; calcifications Specimen(s) Obtained: Breast, left,  needle core biopsy, upper outer quadrant Specimen  Clinical.  The patient is a 68 year old female.   Past Surgical History Nance Pew, CMA; 03/15/2019 11:15 AM) Breast Biopsy Left. Colon Polyp Removal - Colonoscopy Hip Surgery Right. Shoulder Surgery Right.  Diagnostic Studies History Nance Pew, CMA; 03/15/2019 11:15 AM) Colonoscopy 1-5 years ago Mammogram within last year Pap Smear 1-5 years ago  Allergies Nance Pew, CMA; 03/15/2019 11:16 AM) Penicillins Allergies Reconciled  Medication History (Sabrina Canty, CMA; 03/15/2019 11:17 AM) Valsartan (320MG  Tablet, Oral) Active. Simvastatin (20MG  Tablet, Oral) Active. Doxycycline Hyclate (100MG  Capsule, Oral) Active. amLODIPine Besylate (5MG  Tablet, Oral) Active. Vitamin D (Ergocalciferol) (1.25 MG(50000 UT) Capsule, Oral) Active. Medications Reconciled  Social History Nance Pew, CMA; 03/15/2019 11:15 AM) Alcohol use Moderate alcohol use. Caffeine use Coffee. No drug use Tobacco use Former smoker.  Family History Nance Pew, [place]; 03/15/2019 11:15 AM) Anesthetic complications Father. Arthritis Mother. Hypertension Father, Mother. Respiratory Condition Mother.  Pregnancy / Birth History Nance Pew, [place]; 03/15/2019 11:15 AM) Age at menarche 36 years. Age of menopause 51-55 Contraceptive History Oral contraceptives. Gravida 2 Maternal age 30-25 Para 2  Other Problems Nance Pew, CMA; 03/15/2019 11:15 AM) Anxiety Disorder High blood pressure Hypercholesterolemia     Review of Systems (Sabrina Canty CMA; 03/15/2019 11:15 AM) General Not Present- Appetite Loss, Chills, Fatigue, Fever, Night Sweats, Weight Gain and Weight Loss. Skin Not Present- Change in Wart/Mole, Dryness, Hives, Jaundice, New Lesions, Non-Healing Wounds, Rash and Ulcer. HEENT Present- Wears glasses/contact lenses. Not Present- Earache, Hearing Loss, Hoarseness, Nose Bleed, Oral Ulcers, Ringing in the Ears,  Seasonal Allergies, Sinus Pain, Sore Throat, Visual Disturbances and Yellow Eyes. Respiratory Not Present- Bloody sputum, Chronic Cough, Difficulty Breathing, Snoring and Wheezing. Breast Not Present- Breast Mass, Breast Pain, Nipple Discharge and Skin Changes. Cardiovascular Not Present- Chest Pain, Difficulty Breathing Lying Down, Leg Cramps, Palpitations, Rapid Heart Rate, Shortness of Breath and Swelling of Extremities. Gastrointestinal Not Present- Abdominal Pain, Bloating, Bloody Stool, Change in Bowel Habits, Chronic diarrhea, Constipation, Difficulty Swallowing, Excessive gas, Gets full quickly at meals, Hemorrhoids, Indigestion, Nausea, Rectal Pain and Vomiting. Female Genitourinary Not Present- Frequency, Nocturia, Painful Urination, Pelvic Pain and Urgency. Musculoskeletal Not Present- Back Pain, Joint Pain, Joint Stiffness, Muscle Pain, Muscle Weakness and Swelling of Extremities. Neurological Not Present- Decreased Memory, Fainting, Headaches, Numbness, Seizures, Tingling, Tremor, Trouble walking and Weakness. Psychiatric Not Present- Anxiety, Bipolar, Change in Sleep Pattern, Depression, Fearful and Frequent crying. Endocrine Present- Hot flashes. Not Present- Cold Intolerance, Excessive Hunger, Hair Changes, Heat Intolerance and New Diabetes. Hematology Not Present- Blood Thinners, Easy Bruising, Excessive bleeding, Gland problems, HIV and Persistent Infections.  Vitals (Sabrina Canty CMA; 03/15/2019 11:18 AM) 03/15/2019 11:17 AM Weight: 171.6 lb Height: 68in Body Surface Area: 1.92 m Body Mass Index: 26.09 kg/m  Temp.: 97.26F(Temporal)  Pulse: 97 (Regular)  BP: 138/74 (Sitting, Left Arm, Standard)        Physical Exam (Gildo Crisco A. Danne Scardina MD; 03/15/2019 12:42 PM)  General Mental Status-Alert. General Appearance-Consistent with stated age. Hydration-Well hydrated. Voice-Normal.  Head and Neck Head-normocephalic, atraumatic with no lesions or palpable  masses. Trachea-midline. Thyroid Gland Characteristics - normal size and consistency.  Chest and Lung Exam Chest and lung exam reveals -quiet, even and easy respiratory effort with no use of accessory muscles and on auscultation, normal breath sounds, no adventitious sounds and normal vocal resonance. Inspection Chest Wall - Normal. Back - normal.  Breast Note: Bruising central left breast. Small hematoma. Scar left breast upper outer quadrant. Right breast normal.  Neurologic Neurologic evaluation reveals -alert  and oriented x 3 with no impairment of recent or remote memory. Mental Status-Normal.  Lymphatic Head & Neck  General Head & Neck Lymphatics: Bilateral - Description - Normal. Axillary  General Axillary Region: Bilateral - Description - Normal. Tenderness - Non Tender.    Assessment & Plan (Trell Secrist A. 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 Discussed potential upgrade risk of 5%. Risk of lumpectomy include bleeding, infection, seroma, more surgery, use of seed/wire, wound care, cosmetic deformity and the need for other treatments, death , blood clots, death. Pt agrees to proceed.   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 She may benefit from high risk breast clinic follow-up in the future. She may also qualify for biannual magnetic resonance imaging. Postoperative for high risk clinic.  Current Plans You are being scheduled for surgery- Our schedulers will call you.  You should hear from our office's scheduling department within 5 working days about the location, date, and time of surgery. We try to make accommodations for patient's preferences in scheduling surgery, but sometimes the OR schedule or the  surgeon's schedule prevents [place] from making those accommodations.  If you have not heard from our office [phone]) in 5 working days, call the office and ask for your surgeon's nurse.  If you have other questions about your diagnosis, plan, or surgery, call the office and ask for your surgeon's nurse.  Pt Education - [facility] Breast Biopsy HCI: discussed with patient and provided information.

## 2019-03-25 DIAGNOSIS — Z0000 Encounter for general adult medical examination without abnormal findings: Secondary | ICD-10-CM | POA: Diagnosis not present

## 2019-03-25 DIAGNOSIS — I10 Essential (primary) hypertension: Secondary | ICD-10-CM | POA: Diagnosis not present

## 2019-03-25 DIAGNOSIS — I700 Atherosclerosis of aorta: Secondary | ICD-10-CM | POA: Diagnosis not present

## 2019-03-25 DIAGNOSIS — E785 Hyperlipidemia, unspecified: Secondary | ICD-10-CM | POA: Diagnosis not present

## 2019-03-25 DIAGNOSIS — G4700 Insomnia, unspecified: Secondary | ICD-10-CM | POA: Diagnosis not present

## 2019-03-25 DIAGNOSIS — E559 Vitamin D deficiency, unspecified: Secondary | ICD-10-CM | POA: Diagnosis not present

## 2019-03-25 DIAGNOSIS — F419 Anxiety disorder, unspecified: Secondary | ICD-10-CM | POA: Diagnosis not present

## 2019-03-27 ENCOUNTER — Other Ambulatory Visit: Payer: Self-pay | Admitting: Surgery

## 2019-03-27 DIAGNOSIS — N6320 Unspecified lump in the left breast, unspecified quadrant: Secondary | ICD-10-CM

## 2019-04-11 ENCOUNTER — Encounter (HOSPITAL_BASED_OUTPATIENT_CLINIC_OR_DEPARTMENT_OTHER): Payer: Self-pay | Admitting: *Deleted

## 2019-04-11 ENCOUNTER — Other Ambulatory Visit: Payer: Self-pay

## 2019-04-16 ENCOUNTER — Encounter (HOSPITAL_BASED_OUTPATIENT_CLINIC_OR_DEPARTMENT_OTHER)
Admission: RE | Admit: 2019-04-16 | Discharge: 2019-04-16 | Disposition: A | Discharge status: Home / Self Care | Payer: Medicare Other | Source: Ambulatory Visit | Attending: Surgery | Admitting: Surgery

## 2019-04-16 ENCOUNTER — Other Ambulatory Visit (HOSPITAL_COMMUNITY)
Admission: RE | Admit: 2019-04-16 | Discharge: 2019-04-16 | Disposition: A | Discharge status: Home / Self Care | Payer: Medicare Other | Source: Ambulatory Visit | Attending: Surgery | Admitting: Surgery

## 2019-04-16 ENCOUNTER — Other Ambulatory Visit: Payer: Self-pay

## 2019-04-16 DIAGNOSIS — Z20828 Contact with and (suspected) exposure to other viral communicable diseases: Secondary | ICD-10-CM | POA: Diagnosis not present

## 2019-04-16 DIAGNOSIS — Z01812 Encounter for preprocedural laboratory examination: Secondary | ICD-10-CM | POA: Insufficient documentation

## 2019-04-16 LAB — COMPREHENSIVE METABOLIC PANEL
ALT: 43 U/L (ref 0–44)
AST: 36 U/L (ref 15–41)
Albumin: 4.5 g/dL (ref 3.5–5.0)
Alkaline Phosphatase: 69 U/L (ref 38–126)
Anion gap: 9 (ref 5–15)
BUN: 15 mg/dL (ref 8–23)
CO2: 24 mmol/L (ref 22–32)
Calcium: 9.6 mg/dL (ref 8.9–10.3)
Chloride: 107 mmol/L (ref 98–111)
Creatinine, Ser: 0.89 mg/dL (ref 0.44–1.00)
GFR calc Af Amer: >60 mL/min (ref >60)
GFR calc non Af Amer: >60 mL/min (ref >60)
Glucose, Bld: 104 mg/dL — ABNORMAL HIGH (ref 70–99)
Potassium: 4.4 mmol/L (ref 3.5–5.1)
Sodium: 140 mmol/L (ref 135–145)
Total Bilirubin: 0.6 mg/dL (ref 0.3–1.2)
Total Protein: 7.1 g/dL (ref 6.5–8.1)

## 2019-04-16 LAB — CBC WITH DIFFERENTIAL/PLATELET
Abs Immature Granulocytes: 0.04 10*3/uL (ref 0.00–0.07)
Basophils Absolute: 0.1 10*3/uL (ref 0.0–0.1)
Basophils Relative: 1 %
Eosinophils Absolute: 0.1 10*3/uL (ref 0.0–0.5)
Eosinophils Relative: 1 %
HCT: 39.8 % (ref 36.0–46.0)
Hemoglobin: 13.8 g/dL (ref 12.0–15.0)
Immature Granulocytes: 1 %
Lymphocytes Relative: 18 %
Lymphs Abs: 1.1 10*3/uL (ref 0.7–4.0)
MCH: 31.9 pg (ref 26.0–34.0)
MCHC: 34.7 g/dL (ref 30.0–36.0)
MCV: 92.1 fL (ref 80.0–100.0)
Monocytes Absolute: 0.4 10*3/uL (ref 0.1–1.0)
Monocytes Relative: 7 %
Neutro Abs: 4.5 10*3/uL (ref 1.7–7.7)
Neutrophils Relative %: 72 %
Platelets: 217 10*3/uL (ref 150–400)
RBC: 4.32 MIL/uL (ref 3.87–5.11)
RDW: 13.2 % (ref 11.5–15.5)
WBC: 6.3 10*3/uL (ref 4.0–10.5)
nRBC: 0 % (ref 0.0–0.2)

## 2019-04-16 LAB — SARS CORONAVIRUS 2 (TAT 6-24 HRS): SARS Coronavirus 2: NEGATIVE

## 2019-04-16 NOTE — Progress Notes (Signed)

## 2019-04-17 ENCOUNTER — Ambulatory Visit
Admission: RE | Admit: 2019-04-17 | Discharge: 2019-04-17 | Disposition: A | Discharge status: Home / Self Care | Payer: Medicare Other | Source: Ambulatory Visit | Attending: Surgery | Admitting: Surgery

## 2019-04-17 DIAGNOSIS — R928 Other abnormal and inconclusive findings on diagnostic imaging of breast: Secondary | ICD-10-CM | POA: Diagnosis not present

## 2019-04-17 DIAGNOSIS — N6320 Unspecified lump in the left breast, unspecified quadrant: Secondary | ICD-10-CM

## 2019-04-18 ENCOUNTER — Ambulatory Visit (HOSPITAL_BASED_OUTPATIENT_CLINIC_OR_DEPARTMENT_OTHER): Payer: Medicare Other | Admitting: Certified Registered"

## 2019-04-18 ENCOUNTER — Encounter (HOSPITAL_BASED_OUTPATIENT_CLINIC_OR_DEPARTMENT_OTHER)
Admission: RE | Disposition: A | Discharge status: Home / Self Care | Payer: Self-pay | Source: Home / Self Care | Attending: Surgery

## 2019-04-18 ENCOUNTER — Ambulatory Visit (HOSPITAL_BASED_OUTPATIENT_CLINIC_OR_DEPARTMENT_OTHER)
Admission: RE | Admit: 2019-04-18 | Discharge: 2019-04-18 | Disposition: A | Discharge status: Home / Self Care | Payer: Medicare Other | Attending: Surgery | Admitting: Surgery

## 2019-04-18 ENCOUNTER — Ambulatory Visit
Admission: RE | Admit: 2019-04-18 | Discharge: 2019-04-18 | Disposition: A | Discharge status: Home / Self Care | Payer: Medicare Other | Source: Ambulatory Visit | Attending: Surgery | Admitting: Surgery

## 2019-04-18 DIAGNOSIS — F419 Anxiety disorder, unspecified: Secondary | ICD-10-CM | POA: Diagnosis not present

## 2019-04-18 DIAGNOSIS — E7800 Pure hypercholesterolemia, unspecified: Secondary | ICD-10-CM | POA: Insufficient documentation

## 2019-04-18 DIAGNOSIS — Z881 Allergy status to other antibiotic agents status: Secondary | ICD-10-CM | POA: Diagnosis not present

## 2019-04-18 DIAGNOSIS — Z8249 Family history of ischemic heart disease and other diseases of the circulatory system: Secondary | ICD-10-CM | POA: Diagnosis not present

## 2019-04-18 DIAGNOSIS — Z79899 Other long term (current) drug therapy: Secondary | ICD-10-CM | POA: Insufficient documentation

## 2019-04-18 DIAGNOSIS — D4862 Neoplasm of uncertain behavior of left breast: Secondary | ICD-10-CM | POA: Diagnosis not present

## 2019-04-18 DIAGNOSIS — I10 Essential (primary) hypertension: Secondary | ICD-10-CM | POA: Diagnosis not present

## 2019-04-18 DIAGNOSIS — Z882 Allergy status to sulfonamides status: Secondary | ICD-10-CM | POA: Insufficient documentation

## 2019-04-18 DIAGNOSIS — N6320 Unspecified lump in the left breast, unspecified quadrant: Secondary | ICD-10-CM

## 2019-04-18 DIAGNOSIS — N6489 Other specified disorders of breast: Secondary | ICD-10-CM | POA: Insufficient documentation

## 2019-04-18 DIAGNOSIS — R928 Other abnormal and inconclusive findings on diagnostic imaging of breast: Secondary | ICD-10-CM | POA: Diagnosis not present

## 2019-04-18 DIAGNOSIS — Z792 Long term (current) use of antibiotics: Secondary | ICD-10-CM | POA: Diagnosis not present

## 2019-04-18 DIAGNOSIS — Z87891 Personal history of nicotine dependence: Secondary | ICD-10-CM | POA: Diagnosis not present

## 2019-04-18 DIAGNOSIS — Z880 Allergy status to penicillin: Secondary | ICD-10-CM | POA: Diagnosis not present

## 2019-04-18 DIAGNOSIS — D0502 Lobular carcinoma in situ of left breast: Secondary | ICD-10-CM | POA: Diagnosis not present

## 2019-04-18 HISTORY — DX: Nausea with vomiting, unspecified: R11.2

## 2019-04-18 HISTORY — PX: BREAST LUMPECTOMY WITH RADIOACTIVE SEED LOCALIZATION: SHX6424

## 2019-04-18 HISTORY — DX: Other specified postprocedural states: Z98.890

## 2019-04-18 SURGERY — BREAST LUMPECTOMY WITH RADIOACTIVE SEED LOCALIZATION
Anesthesia: General | Site: Breast | Laterality: Left

## 2019-04-18 MED ORDER — PHENYLEPHRINE HCL (PRESSORS) 10 MG/ML IV SOLN
INTRAVENOUS | Status: DC | PRN
  Administered 2019-04-18: 120 ug via INTRAVENOUS

## 2019-04-18 MED ORDER — GABAPENTIN 300 MG PO CAPS
ORAL_CAPSULE | ORAL | Status: AC
  Filled 2019-04-18: qty 1

## 2019-04-18 MED ORDER — DEXAMETHASONE SODIUM PHOSPHATE 10 MG/ML IJ SOLN
INTRAMUSCULAR | Status: AC
  Filled 2019-04-18: qty 1

## 2019-04-18 MED ORDER — ONDANSETRON HCL 4 MG/2ML IJ SOLN
INTRAMUSCULAR | Status: AC
  Filled 2019-04-18: qty 2

## 2019-04-18 MED ORDER — BUPIVACAINE-EPINEPHRINE (PF) 0.25% -1:200000 IJ SOLN
INTRAMUSCULAR | Status: DC | PRN
  Administered 2019-04-18: 20 mL

## 2019-04-18 MED ORDER — PROPOFOL 10 MG/ML IV BOLUS
INTRAVENOUS | Status: AC
  Filled 2019-04-18: qty 20

## 2019-04-18 MED ORDER — CHLORHEXIDINE GLUCONATE CLOTH 2 % EX PADS: 6.0000 | MEDICATED_PAD | Freq: Once | CUTANEOUS | Status: DC

## 2019-04-18 MED ORDER — CIPROFLOXACIN IN D5W 400 MG/200ML IV SOLN
INTRAVENOUS | Status: AC
  Filled 2019-04-18: qty 200

## 2019-04-18 MED ORDER — KETOROLAC TROMETHAMINE 30 MG/ML IJ SOLN: 30.0000 mg | Freq: Once | INTRAMUSCULAR | Status: DC | PRN

## 2019-04-18 MED ORDER — ACETAMINOPHEN 500 MG PO TABS
1000.0000 mg | ORAL_TABLET | ORAL | Status: AC
  Administered 2019-04-18: 1000 mg via ORAL

## 2019-04-18 MED ORDER — CIPROFLOXACIN IN D5W 400 MG/200ML IV SOLN
400.0000 mg | INTRAVENOUS | Status: AC
  Administered 2019-04-18: 09:00:00 400 mg via INTRAVENOUS

## 2019-04-18 MED ORDER — MIDAZOLAM HCL 2 MG/2ML IJ SOLN
INTRAMUSCULAR | Status: AC
  Filled 2019-04-18: qty 2

## 2019-04-18 MED ORDER — PROMETHAZINE HCL 25 MG/ML IJ SOLN: 6.2500 mg | INTRAMUSCULAR | Status: DC | PRN

## 2019-04-18 MED ORDER — FENTANYL CITRATE (PF) 100 MCG/2ML IJ SOLN
100.0000 ug | INTRAMUSCULAR | Status: DC | PRN
  Administered 2019-04-18 (×2): 50 ug via INTRAVENOUS

## 2019-04-18 MED ORDER — DEXAMETHASONE SODIUM PHOSPHATE 10 MG/ML IJ SOLN
INTRAMUSCULAR | Status: DC | PRN
  Administered 2019-04-18: 10 mg via INTRAVENOUS

## 2019-04-18 MED ORDER — LIDOCAINE HCL (CARDIAC) PF 100 MG/5ML IV SOSY
PREFILLED_SYRINGE | INTRAVENOUS | Status: DC | PRN
  Administered 2019-04-18: 100 mg via INTRAVENOUS

## 2019-04-18 MED ORDER — ACETAMINOPHEN 500 MG PO TABS
ORAL_TABLET | ORAL | Status: AC
  Filled 2019-04-18: qty 2

## 2019-04-18 MED ORDER — LIDOCAINE 2% (20 MG/ML) 5 ML SYRINGE
INTRAMUSCULAR | Status: AC
  Filled 2019-04-18: qty 5

## 2019-04-18 MED ORDER — HYDROCODONE-ACETAMINOPHEN 5-325 MG PO TABS: 1.0000 | ORAL_TABLET | Freq: Four times a day (QID) | ORAL | 0 refills | Status: DC | PRN

## 2019-04-18 MED ORDER — GABAPENTIN 300 MG PO CAPS
300.0000 mg | ORAL_CAPSULE | ORAL | Status: AC
  Administered 2019-04-18: 08:00:00 300 mg via ORAL

## 2019-04-18 MED ORDER — LACTATED RINGERS IV SOLN
INTRAVENOUS | Status: DC
  Administered 2019-04-18: 08:00:00 via INTRAVENOUS

## 2019-04-18 MED ORDER — IBUPROFEN 800 MG PO TABS: 800.0000 mg | ORAL_TABLET | Freq: Three times a day (TID) | ORAL | 0 refills | Status: DC | PRN

## 2019-04-18 MED ORDER — PROPOFOL 10 MG/ML IV BOLUS
INTRAVENOUS | Status: DC | PRN
  Administered 2019-04-18: 150 mg via INTRAVENOUS

## 2019-04-18 MED ORDER — FENTANYL CITRATE (PF) 100 MCG/2ML IJ SOLN
INTRAMUSCULAR | Status: AC
  Filled 2019-04-18: qty 2

## 2019-04-18 MED ORDER — MIDAZOLAM HCL 2 MG/2ML IJ SOLN
1.0000 mg | INTRAMUSCULAR | Status: DC | PRN
  Administered 2019-04-18: 2 mg via INTRAVENOUS

## 2019-04-18 MED ORDER — FENTANYL CITRATE (PF) 100 MCG/2ML IJ SOLN
25.0000 ug | INTRAMUSCULAR | Status: DC | PRN
  Administered 2019-04-18: 50 ug via INTRAVENOUS

## 2019-04-18 MED ORDER — ONDANSETRON HCL 4 MG/2ML IJ SOLN
INTRAMUSCULAR | Status: DC | PRN
  Administered 2019-04-18: 4 mg via INTRAVENOUS

## 2019-04-18 SURGICAL SUPPLY — 51 items
ADH SKN CLS APL DERMABOND .7 (GAUZE/BANDAGES/DRESSINGS) ×1
APL PRP STRL LF DISP 70% ISPRP (MISCELLANEOUS) ×1
APPLIER CLIP 9.375 MED OPEN (MISCELLANEOUS)
APR CLP MED 9.3 20 MLT OPN (MISCELLANEOUS)
BINDER BREAST LRG (GAUZE/BANDAGES/DRESSINGS) ×2 IMPLANT
BINDER BREAST MEDIUM (GAUZE/BANDAGES/DRESSINGS) IMPLANT
BINDER BREAST XLRG (GAUZE/BANDAGES/DRESSINGS) IMPLANT
BINDER BREAST XXLRG (GAUZE/BANDAGES/DRESSINGS) IMPLANT
BLADE SURG 15 STRL LF DISP TIS (BLADE) ×1 IMPLANT
BLADE SURG 15 STRL SS (BLADE) ×3
CANISTER SUC SOCK COL 7IN (MISCELLANEOUS) IMPLANT
CANISTER SUCT 1200ML W/VALVE (MISCELLANEOUS) IMPLANT
CHLORAPREP W/TINT 26 (MISCELLANEOUS) ×3 IMPLANT
CLIP APPLIE 9.375 MED OPEN (MISCELLANEOUS) IMPLANT
COVER BACK TABLE REUSABLE LG (DRAPES) ×3 IMPLANT
COVER MAYO STAND REUSABLE (DRAPES) ×3 IMPLANT
COVER PROBE W GEL 5X96 (DRAPES) ×3 IMPLANT
COVER WAND RF STERILE (DRAPES) IMPLANT
DECANTER SPIKE VIAL GLASS SM (MISCELLANEOUS) ×2 IMPLANT
DERMABOND ADVANCED (GAUZE/BANDAGES/DRESSINGS) ×2
DERMABOND ADVANCED .7 DNX12 (GAUZE/BANDAGES/DRESSINGS) ×1 IMPLANT
DRAPE LAPAROSCOPIC ABDOMINAL (DRAPES) IMPLANT
DRAPE LAPAROTOMY 100X72 PEDS (DRAPES) ×3 IMPLANT
DRAPE UTILITY XL STRL (DRAPES) ×3 IMPLANT
ELECT COATED BLADE 2.86 ST (ELECTRODE) ×3 IMPLANT
ELECT REM PT RETURN 9FT ADLT (ELECTROSURGICAL) ×3
ELECTRODE REM PT RTRN 9FT ADLT (ELECTROSURGICAL) ×1 IMPLANT
GLOVE BIOGEL PI IND STRL 8 (GLOVE) ×1 IMPLANT
GLOVE BIOGEL PI INDICATOR 8 (GLOVE) ×2
GLOVE ECLIPSE 8.0 STRL XLNG CF (GLOVE) ×3 IMPLANT
GOWN STRL REUS W/ TWL LRG LVL3 (GOWN DISPOSABLE) ×2 IMPLANT
GOWN STRL REUS W/TWL LRG LVL3 (GOWN DISPOSABLE) ×6
HEMOSTAT ARISTA ABSORB 3G PWDR (HEMOSTASIS) IMPLANT
HEMOSTAT SNOW SURGICEL 2X4 (HEMOSTASIS) IMPLANT
KIT MARKER MARGIN INK (KITS) ×3 IMPLANT
NDL HYPO 25X1 1.5 SAFETY (NEEDLE) ×1 IMPLANT
NEEDLE HYPO 25X1 1.5 SAFETY (NEEDLE) ×3 IMPLANT
NS IRRIG 1000ML POUR BTL (IV SOLUTION) ×3 IMPLANT
PACK BASIN DAY SURGERY FS (CUSTOM PROCEDURE TRAY) ×3 IMPLANT
PENCIL BUTTON HOLSTER BLD 10FT (ELECTRODE) ×3 IMPLANT
SLEEVE SCD COMPRESS KNEE MED (MISCELLANEOUS) ×3 IMPLANT
SPONGE LAP 4X18 RFD (DISPOSABLE) ×3 IMPLANT
SUT MNCRL AB 4-0 PS2 18 (SUTURE) ×3 IMPLANT
SUT SILK 2 0 SH (SUTURE) IMPLANT
SUT VICRYL 3-0 CR8 SH (SUTURE) ×3 IMPLANT
SYR CONTROL 10ML LL (SYRINGE) ×3 IMPLANT
TOWEL GREEN STERILE FF (TOWEL DISPOSABLE) ×3 IMPLANT
TRAY FAXITRON CT DISP (TRAY / TRAY PROCEDURE) ×3 IMPLANT
TUBE CONNECTING 20'X1/4 (TUBING)
TUBE CONNECTING 20X1/4 (TUBING) IMPLANT
YANKAUER SUCT BULB TIP NO VENT (SUCTIONS) IMPLANT

## 2019-04-18 NOTE — Transfer of Care (Signed)
Immediate Anesthesia Transfer of Care Note  Patient: Haley [name]  Procedure(s) Performed: LEFT BREAST LUMPECTOMY WITH RADIOACTIVE SEED LOCALIZATION (Left Breast)  Patient Location: PACU  Anesthesia Type:General  Level of Consciousness: awake, alert  and oriented  Airway & Oxygen Therapy: Patient Spontanous Breathing and Patient connected to face mask oxygen  Post-op Assessment: Report given to RN and Post -op Vital signs reviewed and stable  Post vital signs: Reviewed and stable  Last Vitals:  Vitals Value Taken Time  BP    Temp    Pulse 76 04/18/19 0926  Resp    SpO2 96 % 04/18/19 0926  Vitals shown include unvalidated device data.  Last Pain:  Vitals:   04/18/19 0825  TempSrc: Oral  PainSc: 0-No pain         Complications: No apparent anesthesia complications

## 2019-04-18 NOTE — H&P (Signed)
eborah B Twardowski Documented: 03/15/2019 11:15 AM Location: [facility] Patient #: [id number] DOB: 07/30/1951 Married / Language: English / Race: White Female  History of Present Illness (Jacen Carlini A. Matina Rodier MD; 03/15/2019 12:41 PM) Patient words: Patient sent at the request of the [facility] [place] for left breast mammographic abnormality. 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 She was noted to have a mammographic density in the left upper quadrant core biopsy consistent with radial scar but LCIS was noted as well specimen. The patient has no complaints today. She does have some swelling and bruising at the operative site and a small hematoma. She is not taking antiestrogen as the past and had a right breast lumpectomy for fibroadenoma.       CLINICAL DATA: 68-year-old presenting with a possible palpable lump in the high LEFT axilla. Annual mammographic evaluation. Prior high risk excisional biopsy of the LEFT breast in 2009, pathology LCIS.  EXAM: DIGITAL DIAGNOSTIC BILATERAL MAMMOGRAM WITH CAD AND TOMO  ULTRASOUND LEFT AXILLA  COMPARISON: Previous exam(s).  ACR Breast Density Category c: The breast tissue is heterogeneously dense, which may obscure small masses.  FINDINGS: Tomosynthesis and synthesized full field CC and MLO views of both breasts were obtained. Tomosynthesis and synthesized spot compression tangential view of the area of concern in the high LEFT axilla and standard spot magnification views of LEFT breast calcifications were also obtained.  No mammographic abnormality in the area of palpable concern in the high LEFT axilla. Normal fibrofatty tissue is present in this location and there is no visible pathologic lymphadenopathy.  Spot magnification views confirm a an approximate 10 mm group of coarse heterogeneous calcifications in the UPPER OUTER QUADRANT of the LEFT breast at ANTERIOR depth. These calcifications are  near the scar/architectural distortion related to the prior excisional biopsy, but are not centered in the scar.  No findings suspicious for malignancy RIGHT breast.  Mammographic images were processed with CAD.  On correlative physical exam, there is a prominent LEFT superior axillary fat pad corresponding to the patient concern, though I do not palpate a mass or lymphadenopathy.  Targeted LEFT axillary ultrasound is performed, showing normal appearing lymph nodes. There is no pathologic adenopathy. There is no mass in the area of patient concern the upper axilla.  IMPRESSION: 1. Indeterminate 10 mm group of coarse heterogeneous calcifications in the UPPER OUTER QUADRANT of the LEFT breast at ANTERIOR depth. 2. No pathologic LEFT axillary lymphadenopathy. 3. No mammographic evidence of malignancy involving the RIGHT breast.  RECOMMENDATION: Stereotactic tomosynthesis core needle biopsy of the indeterminate LEFT breast calcifications.  The stereotactic tomosynthesis core needle biopsy procedure was discussed with patient her questions were answered. She has agreed to proceed and the biopsy has been scheduled Monday, July 27 at 10:30 a.m.  I have discussed the findings and recommendations with the patient.  BI-RADS CATEGORY 4: Suspicious.   Electronically Signed By: Kareemah Grounds Lawrence M.D. 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 Microscopic Comment The LCIS has calcifications and pleomorphic features. Dr. Kish has reviewed the case. The case was called to [facility] on 03/05/2019. JULIA MANNY MD Pathologist, Electronic Signature (Case signed 03/05/2019) Specimen Gross and Clinical Information Specimen Comment Time in formalin: 10:55 AM, extracted 3 minutes; calcifications Specimen(s) Obtained: Breast, left,  needle core biopsy, upper outer quadrant Specimen   Clinical.  The patient is a 68 year old female.   Past Surgical History (Sabrina Canty, CMA; 03/15/2019 11:15 AM) Breast Biopsy Left. Colon Polyp Removal - Colonoscopy Hip Surgery Right. Shoulder Surgery Right.  Diagnostic Studies History (Sabrina Canty, CMA; 03/15/2019 11:15 AM) Colonoscopy 1-5 years ago Mammogram within last year Pap Smear 1-5 years ago  Allergies (Sabrina Canty, CMA; 03/15/2019 11:16 AM) Penicillins Allergies Reconciled  Medication History (Sabrina Canty, CMA; 03/15/2019 11:17 AM) Valsartan (320MG Tablet, Oral) Active. Simvastatin (20MG Tablet, Oral) Active. Doxycycline Hyclate (100MG Capsule, Oral) Active. amLODIPine Besylate (5MG Tablet, Oral) Active. Vitamin D (Ergocalciferol) (1.25 MG(50000 UT) Capsule, Oral) Active. Medications Reconciled  Social History (Sabrina Canty, CMA; 03/15/2019 11:15 AM) Alcohol use Moderate alcohol use. Caffeine use Coffee. No drug use Tobacco use Former smoker.  Family History (Sabrina Canty, CMA; 03/15/2019 11:15 AM) Anesthetic complications Father. Arthritis Mother. Hypertension Father, Mother. Respiratory Condition Mother.  Pregnancy / Birth History (Sabrina Canty, CMA; 03/15/2019 11:15 AM) Age at menarche 12 years. Age of menopause 51-55 Contraceptive History Oral contraceptives. Gravida 2 Maternal age 21-25 Para 2  Other Problems (Sabrina Canty, CMA; 03/15/2019 11:15 AM) Anxiety Disorder High blood pressure Hypercholesterolemia     Review of Systems (Sabrina Canty CMA; 03/15/2019 11:15 AM) General Not Present- Appetite Loss, Chills, Fatigue, Fever, Night Sweats, Weight Gain and Weight Loss. Skin Not Present- Change in Wart/Mole, Dryness, Hives, Jaundice, New Lesions, Non-Healing Wounds, Rash and Ulcer. HEENT Present- Wears glasses/contact lenses. Not Present- Earache, Hearing Loss, Hoarseness, Nose Bleed, Oral Ulcers, Ringing in the Ears,  Seasonal Allergies, Sinus Pain, Sore Throat, Visual Disturbances and Yellow Eyes. Respiratory Not Present- Bloody sputum, Chronic Cough, Difficulty Breathing, Snoring and Wheezing. Breast Not Present- Breast Mass, Breast Pain, Nipple Discharge and Skin Changes. Cardiovascular Not Present- Chest Pain, Difficulty Breathing Lying Down, Leg Cramps, Palpitations, Rapid Heart Rate, Shortness of Breath and Swelling of Extremities. Gastrointestinal Not Present- Abdominal Pain, Bloating, Bloody Stool, Change in Bowel Habits, Chronic diarrhea, Constipation, Difficulty Swallowing, Excessive gas, Gets full quickly at meals, Hemorrhoids, Indigestion, Nausea, Rectal Pain and Vomiting. Female Genitourinary Not Present- Frequency, Nocturia, Painful Urination, Pelvic Pain and Urgency. Musculoskeletal Not Present- Back Pain, Joint Pain, Joint Stiffness, Muscle Pain, Muscle Weakness and Swelling of Extremities. Neurological Not Present- Decreased Memory, Fainting, Headaches, Numbness, Seizures, Tingling, Tremor, Trouble walking and Weakness. Psychiatric Not Present- Anxiety, Bipolar, Change in Sleep Pattern, Depression, Fearful and Frequent crying. Endocrine Present- Hot flashes. Not Present- Cold Intolerance, Excessive Hunger, Hair Changes, Heat Intolerance and New Diabetes. Hematology Not Present- Blood Thinners, Easy Bruising, Excessive bleeding, Gland problems, HIV and Persistent Infections.  Vitals (Sabrina Canty CMA; 03/15/2019 11:18 AM) 03/15/2019 11:17 AM Weight: 171.6 lb Height: 68in Body Surface Area: 1.92 m Body Mass Index: 26.09 kg/m  Temp.: 97.1F(Temporal)  Pulse: 97 (Regular)  BP: 138/74 (Sitting, Left Arm, Standard)        Physical Exam (Santana Gosdin A. Briena Swingler MD; 03/15/2019 12:42 PM)  General Mental Status-Alert. General Appearance-Consistent with stated age. Hydration-Well hydrated. Voice-Normal.  Head and Neck Head-normocephalic, atraumatic with no lesions or palpable  masses. Trachea-midline. Thyroid Gland Characteristics - normal size and consistency.  Chest and Lung Exam Chest and lung exam reveals -quiet, even and easy respiratory effort with no use of accessory muscles and on auscultation, normal breath sounds, no adventitious sounds and normal vocal resonance. Inspection Chest Wall - Normal. Back - normal.  Breast Note: Bruising central left breast. Small hematoma. Scar left breast upper outer quadrant. Right breast normal.  Neurologic Neurologic evaluation reveals -alert   and oriented x 3 with no impairment of recent or remote memory. Mental Status-Normal.  Lymphatic Head & Neck  General Head & Neck Lymphatics: Bilateral - Description - Normal. Axillary  General Axillary Region: Bilateral - Description - Normal. Tenderness - Non Tender.    Assessment & Plan (Marcelus Dubberly A. 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 Discussed potential upgrade risk of 5%. Risk of lumpectomy include bleeding, infection, seroma, more surgery, use of seed/wire, wound care, cosmetic deformity and the need for other treatments, death , blood clots, death. Pt agrees to proceed.   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 She may benefit from high risk breast clinic follow-up in the future. She may also qualify for biannual magnetic resonance imaging. Postoperative for high risk clinic.  Current Plans You are being scheduled for surgery- Our schedulers will call you.  You should hear from our office's scheduling department within 5 working days about the location, date, and time of surgery. We try to make accommodations for patient's preferences in scheduling surgery, but sometimes the OR schedule or the  surgeon's schedule prevents us from making those accommodations.  If you have not heard from our office ([phone]) in 5 working days, call the office and ask for your surgeon's nurse.  If you have other questions about your diagnosis, plan, or surgery, call the office and ask for your surgeon's nurse.  Pt Education - [facility] Breast Biopsy HCI: discussed with patient and provided information. 

## 2019-04-18 NOTE — Discharge Instructions (Signed)
[facility] °Office Phone Number [phone] ° °BREAST BIOPSY/ PARTIAL MASTECTOMY: POST OP INSTRUCTIONS ° °Always review your discharge instruction sheet given to you by the facility where your surgery was performed. ° °IF YOU HAVE DISABILITY OR FAMILY LEAVE FORMS, YOU MUST BRING THEM TO THE OFFICE FOR PROCESSING.  DO NOT GIVE THEM TO YOUR DOCTOR. ° °1. A prescription for pain medication may be given to you upon discharge.  Take your pain medication as prescribed, if needed.  If narcotic pain medicine is not needed, then you may take acetaminophen (Tylenol) or ibuprofen (Advil) as needed. °2. Take your usually prescribed medications unless otherwise directed °3. If you need a refill on your pain medication, please contact your pharmacy.  They will contact our office to request authorization.  Prescriptions will not be filled after 5pm or on week-ends. °4. You should eat very light the first 24 hours after surgery, such as soup, crackers, pudding, etc.  Resume your normal diet the day after surgery. °5. Most patients will experience some swelling and bruising in the breast.  Ice packs and a good support bra will help.  Swelling and bruising can take several days to resolve.  °6. It is common to experience some constipation if taking pain medication after surgery.  Increasing fluid intake and taking a stool softener will usually help or prevent this problem from occurring.  A mild laxative (Milk of Magnesia or Miralax) should be taken according to package directions if there are no bowel movements after 48 hours. °7. Unless discharge instructions indicate otherwise, you may remove your bandages 24-48 hours after surgery, and you may shower at that time.  You may have steri-strips (small skin tapes) in place directly over the incision.  These strips should be left on the skin for 7-10 days.  If your surgeon used skin glue on the incision, you may shower in 24 hours.  The glue will flake off over the  next 2-3 weeks.  Any sutures or staples will be removed at the office during your follow-up visit. °8. ACTIVITIES:  You may resume regular daily activities (gradually increasing) beginning the next day.  Wearing a good support bra or sports bra minimizes pain and swelling.  You may have sexual intercourse when it is comfortable. °a. You may drive when you no longer are taking prescription pain medication, you can comfortably wear a seatbelt, and you can safely maneuver your car and apply brakes. °b. RETURN TO WORK:  ______________________________________________________________________________________ °9. You should see your doctor in the office for a follow-up appointment approximately two weeks after your surgery.  Your doctor’s nurse will typically make your follow-up appointment when she calls you with your pathology report.  Expect your pathology report 2-3 business days after your surgery.  You may call to check if you do not hear from us after three days. °10. OTHER INSTRUCTIONS: _______________________________________________________________________________________________ _____________________________________________________________________________________________________________________________________ °_____________________________________________________________________________________________________________________________________ °_____________________________________________________________________________________________________________________________________ ° °WHEN TO CALL YOUR DOCTOR: °1. Fever over 101.0 °2. Nausea and/or vomiting. °3. Extreme swelling or bruising. °4. Continued bleeding from incision. °5. Increased pain, redness, or drainage from the incision. ° °The clinic staff is available to answer your questions during regular business hours.  Please don’t hesitate to call and ask to speak to one of the nurses for clinical concerns.  If you have a medical emergency, go to the nearest  emergency room or call 911.  A surgeon from [facility] is always on call at the hospital. ° °For further questions, please visit [email or web address]  ° ° ° ° °  May take next dose of Tylenol at 2:00 PM on 04/18/2019 if needed.    Post Anesthesia Home Care Instructions  Activity: Get plenty of rest for the remainder of the day. A responsible individual must stay with you for 24 hours following the procedure.  For the next 24 hours, DO NOT: -Drive a car -Paediatric nurse -Drink alcoholic beverages -Take any medication unless instructed by your physician -Make any legal decisions or sign important papers.  Meals: Start with liquid foods such as gelatin or soup. Progress to regular foods as tolerated. Avoid greasy, spicy, heavy foods. If nausea and/or vomiting occur, drink only clear liquids until the nausea and/or vomiting subsides. Call your physician if vomiting continues.  Special Instructions/Symptoms: Your throat may feel dry or sore from the anesthesia or the breathing tube placed in your throat during surgery. If this causes discomfort, gargle with warm salt water. The discomfort should disappear within 24 hours.  If you had a scopolamine patch placed behind your ear for the management of post- operative nausea and/or vomiting:  1. The medication in the patch is effective for 72 hours, after which it should be removed.  Wrap patch in a tissue and discard in the trash. Wash hands thoroughly with soap and water. 2. You may remove the patch earlier than 72 hours if you experience unpleasant side effects which may include dry mouth, dizziness or visual disturbances. 3. Avoid touching the patch. Wash your hands with soap and water after contact with the patch.

## 2019-04-18 NOTE — Interval H&P Note (Signed)
History and Physical Interval Note:  04/18/2019 8:16 AM  Haley [name]  has presented today for surgery, with the diagnosis of left radial scar.  The various methods of treatment have been discussed with the patient and family. After consideration of risks, benefits and other options for treatment, the patient has consented to  Procedure(s): LEFT BREAST LUMPECTOMY WITH RADIOACTIVE SEED LOCALIZATION (Left) as a surgical intervention.  The patient's history has been reviewed, patient examined, no change in status, stable for surgery.  I have reviewed the patient's chart and labs.  Questions were answered to the patient's satisfaction.     [place]

## 2019-04-18 NOTE — Anesthesia Preprocedure Evaluation (Signed)
Anesthesia Evaluation  Patient identified by MRN, date of birth, ID band Patient awake    Reviewed: Allergy & Precautions, NPO status , Patient's Chart, lab work & pertinent test results  History of Anesthesia Complications (+) PONV  Airway Mallampati: II  TM Distance: >3 FB Neck ROM: Full    Dental no notable dental hx.    Pulmonary neg pulmonary ROS, former smoker,    Pulmonary exam normal breath sounds clear to auscultation       Cardiovascular hypertension, Normal cardiovascular exam Rhythm:Regular Rate:Normal     Neuro/Psych negative neurological ROS  negative psych ROS   GI/Hepatic negative GI ROS, Neg liver ROS,   Endo/Other  negative endocrine ROS  Renal/GU negative Renal ROS  negative genitourinary   Musculoskeletal negative musculoskeletal ROS (+)   Abdominal   Peds negative pediatric ROS (+)  Hematology negative hematology ROS (+)   Anesthesia Other Findings   Reproductive/Obstetrics negative OB ROS                             Anesthesia Physical Anesthesia Plan  ASA: II  Anesthesia Plan: General   Post-op Pain Management:    Induction: Intravenous  PONV Risk Score and Plan: 4 or greater and Ondansetron, Dexamethasone, Scopolamine patch - Pre-op and Treatment may vary due to age or medical condition  Airway Management Planned: LMA  Additional Equipment:   Intra-op Plan:   Post-operative Plan: Extubation in OR  Informed Consent: I have reviewed the patients History and Physical, chart, labs and discussed the procedure including the risks, benefits and alternatives for the proposed anesthesia with the patient or authorized representative who has indicated his/her understanding and acceptance.     Dental advisory given  Plan Discussed with: CRNA and Surgeon  Anesthesia Plan Comments:         Anesthesia Quick Evaluation

## 2019-04-18 NOTE — Op Note (Signed)
Preoperative diagnosis: Left breast radial scar  Postoperative diagnosis: Same   Procedure: Left breast seed localized lumpectomy  Surgeon: Erroll Luna M.D.  Anesthesia: Gen. With 0.25% Sensorcaine local  EBL: 20 cc  Specimen: Left breast tissue with clip and radioactive seed in the specimen. Verified with neoprobe and radiographic image showing both seed and clip in specimen  Indications for procedure: The patient presents for left breast  lumpectomy after core biopsy showed radial scar . Discussed the rationale for considering excision. Small risk of malignancy associated with papilloma lesion after core biopsy. Discussed observation. Discussed wire localization and seed use. Patient desired removal of left breast radial scar.The procedure has been discussed with the patient. Alternatives to surgery have been discussed with the patient.  Risks of surgery include bleeding,  Infection,  Seroma formation, death,  and the need for further surgery.   The patient understands and wishes to proceed.   Description of procedure: Patient underwent seed placement as an outpatient. Patient presents today for left breast seed localized lumpectomy. Patient seen in the  holding area. Questions are answered and neoprobe used to verify seed location. Patient taken back to the operating room and placed upon the OR table. After induction of general anesthesia, left breast prepped and draped in a sterile fashion. Timeout was done to verify proper  procedure. Neoprobe used and hot spot identified and left breast subareolar position. This was marked with pen. Curvilinear incision made superior NAC. Dissection used with the help of a neoprobe around the tissue where the seed and clip were located. Tissue removed in its entirety with gross NEGATIVE margins.. Neoprobe used and seed within specimen. Radiographs taken which show clip and seed in the  specimen.Hemostasis achieved and cavity closed with 3-0 Vicryl and 4-0  Monocryl. Dermabond applied. All final counts found to be correct. Specimen transported to pathology. Patient awoke and  extubated taken to recovery in satisfactory condition.

## 2019-04-18 NOTE — Anesthesia Postprocedure Evaluation (Signed)
Anesthesia Post Note  Patient: Haley [name]  Procedure(s) Performed: LEFT BREAST LUMPECTOMY WITH RADIOACTIVE SEED LOCALIZATION (Left Breast)     Patient location during evaluation: PACU Anesthesia Type: General Level of consciousness: awake and alert Pain management: pain level controlled Vital Signs Assessment: post-procedure vital signs reviewed and stable Respiratory status: spontaneous breathing, nonlabored ventilation, respiratory function stable and patient connected to nasal cannula oxygen Cardiovascular status: blood pressure returned to baseline and stable Postop Assessment: no apparent nausea or vomiting Anesthetic complications: no    Last Vitals:  Vitals:   04/18/19 0930 04/18/19 0945  BP: 111/71 117/68  Pulse: 71 65  Resp: 12 11  Temp:    SpO2: 99% 98%    Last Pain:  Vitals:   04/18/19 0945  TempSrc:   PainSc: 5                  [name]

## 2019-04-19 ENCOUNTER — Encounter (HOSPITAL_BASED_OUTPATIENT_CLINIC_OR_DEPARTMENT_OTHER): Payer: Self-pay | Admitting: Surgery

## 2019-05-02 DIAGNOSIS — I10 Essential (primary) hypertension: Secondary | ICD-10-CM | POA: Diagnosis not present

## 2019-05-02 DIAGNOSIS — E559 Vitamin D deficiency, unspecified: Secondary | ICD-10-CM | POA: Diagnosis not present

## 2019-05-02 DIAGNOSIS — E785 Hyperlipidemia, unspecified: Secondary | ICD-10-CM | POA: Diagnosis not present

## 2019-05-05 DIAGNOSIS — Z23 Encounter for immunization: Secondary | ICD-10-CM | POA: Diagnosis not present

## 2019-09-27 DIAGNOSIS — G4700 Insomnia, unspecified: Secondary | ICD-10-CM | POA: Diagnosis not present

## 2019-09-27 DIAGNOSIS — F419 Anxiety disorder, unspecified: Secondary | ICD-10-CM | POA: Diagnosis not present

## 2019-09-27 DIAGNOSIS — I10 Essential (primary) hypertension: Secondary | ICD-10-CM | POA: Diagnosis not present

## 2019-09-27 DIAGNOSIS — E785 Hyperlipidemia, unspecified: Secondary | ICD-10-CM | POA: Diagnosis not present

## 2019-10-05 IMAGING — US US AXILLARY LEFT
1 series · 4 of 4 positions shown · non-contrast
Comparison: Previous exam(s).

CLINICAL DATA: 68-year-old presenting with a possible palpable lump
in the high LEFT axilla. Annual mammographic evaluation. Prior high
risk excisional biopsy of the LEFT breast in 7662, pathology LCIS.

EXAM:
DIGITAL DIAGNOSTIC BILATERAL MAMMOGRAM WITH CAD AND TOMO
ULTRASOUND LEFT AXILLA

[Series 1: us axillary left · 0.08mm/px · 4 of 4 slices shown]
[im 1/4]
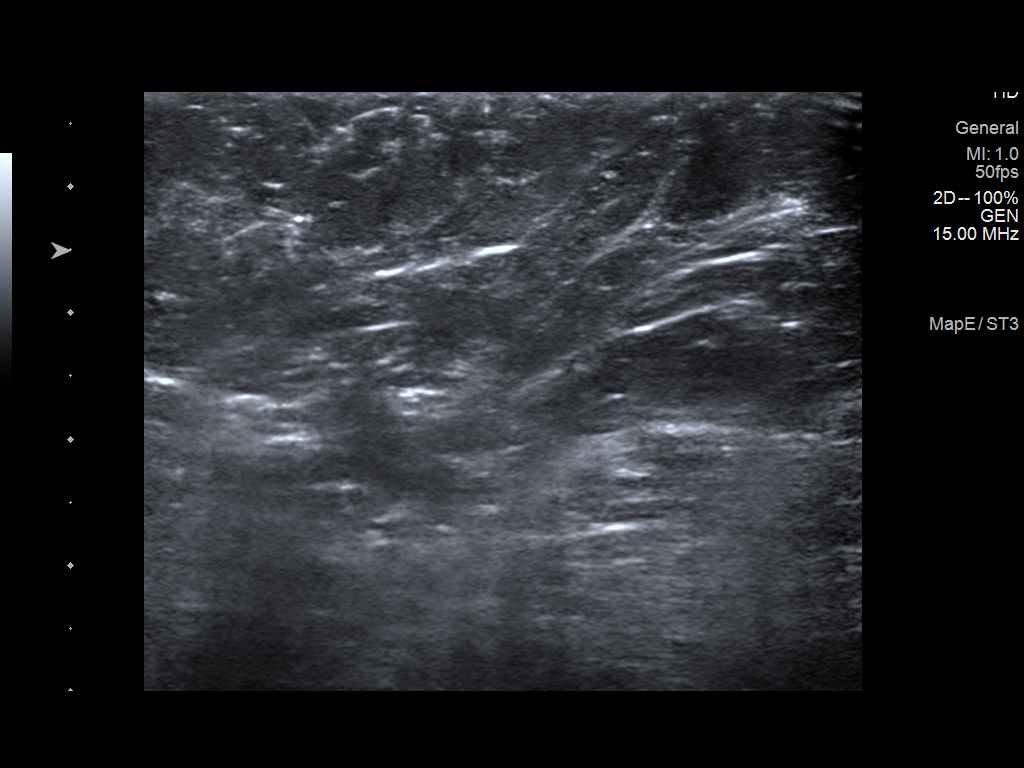
[im 2/4]
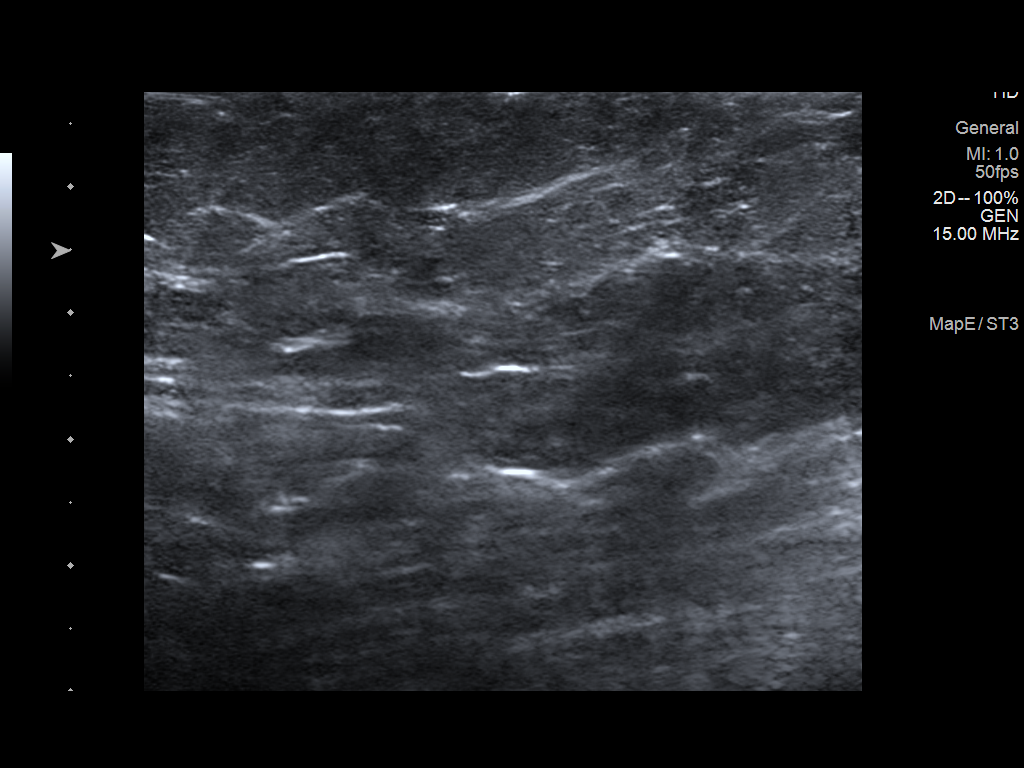
[im 3/4]
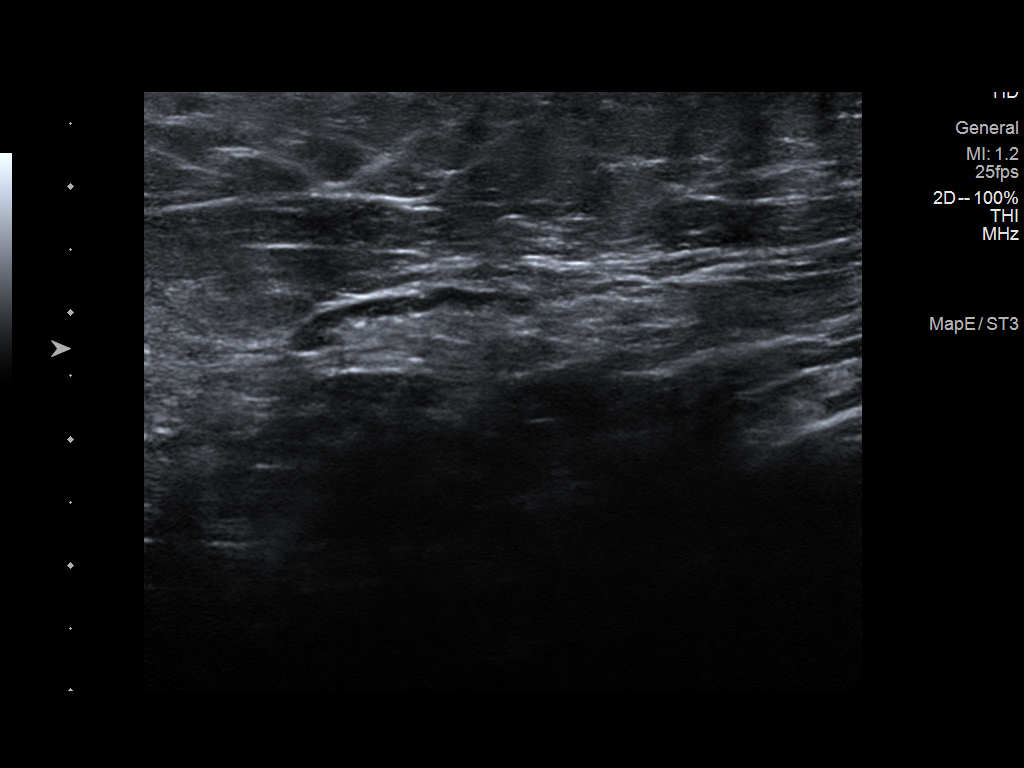
[im 4/4]
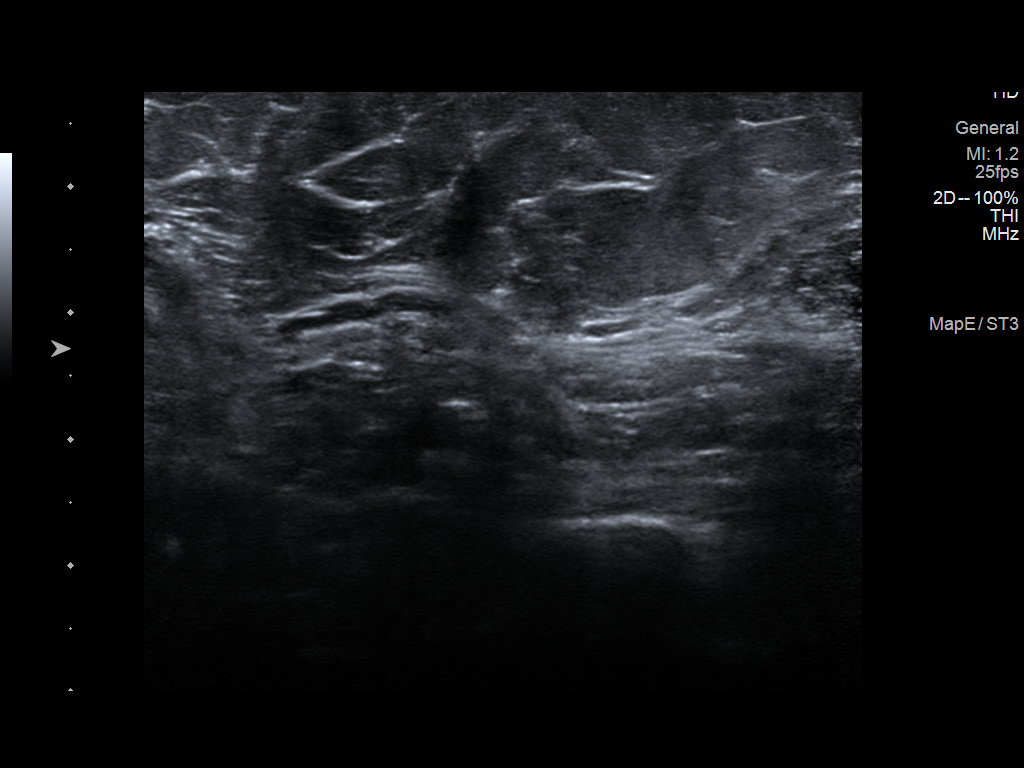

[4 of 4 positions shown; findings below may reference images not displayed]

ACR Breast Density Category c: The breast tissue is heterogeneously
dense, which may obscure small masses.
FINDINGS: Tomosynthesis and synthesized full field CC and MLO views of both
breasts were obtained. Tomosynthesis and synthesized spot
compression tangential view of the area of concern in the high LEFT
axilla and standard spot magnification views of LEFT breast
calcifications were also obtained.

No mammographic abnormality in the area of palpable concern in the
high LEFT axilla. Normal fibrofatty tissue is present in this
location and there is no visible pathologic lymphadenopathy.

Spot magnification views confirm a an approximate 10 mm group of
coarse heterogeneous calcifications in the UPPER OUTER QUADRANT of
the LEFT breast at ANTERIOR depth. These calcifications are near the
scar/architectural distortion related to the prior excisional
biopsy, but are not centered in the scar.

No findings suspicious for malignancy RIGHT breast.

Mammographic images were processed with CAD.

On correlative physical exam, there is a prominent LEFT superior
axillary fat pad corresponding to the patient concern, though I do
not palpate a mass or lymphadenopathy.

Targeted LEFT axillary ultrasound is performed, showing normal
appearing lymph nodes. There is no pathologic adenopathy. There is
no mass in the area of patient concern the upper axilla.
IMPRESSION: 1. Indeterminate 10 mm group of coarse heterogeneous calcifications
in the UPPER OUTER QUADRANT of the LEFT breast at ANTERIOR depth.
2. No pathologic LEFT axillary lymphadenopathy.
3. No mammographic evidence of malignancy involving the RIGHT
breast.

RECOMMENDATION:
Stereotactic tomosynthesis core needle biopsy of the indeterminate
LEFT breast calcifications.

The stereotactic tomosynthesis core needle biopsy procedure was
discussed with patient her questions were answered. She has agreed
to proceed and the biopsy has been scheduled [REDACTED], [DATE] at

I have discussed the findings and recommendations with the patient.

BI-RADS CATEGORY  4: Suspicious.

## 2019-10-09 IMAGING — MG STEREOTACTIC CORE NEEDLE BIOPSY
8 of 10 series · 8 of 18 positions shown · non-contrast
Comparison: Previous exams.
COMPARISON: Previous exams.

Addendum:
CLINICAL DATA: Patient presents for stereotactic core needle biopsy
of left breast calcifications.

EXAM:
LEFT BREAST STEREOTACTIC CORE NEEDLE BIOPSY

[L (1 of 7)]
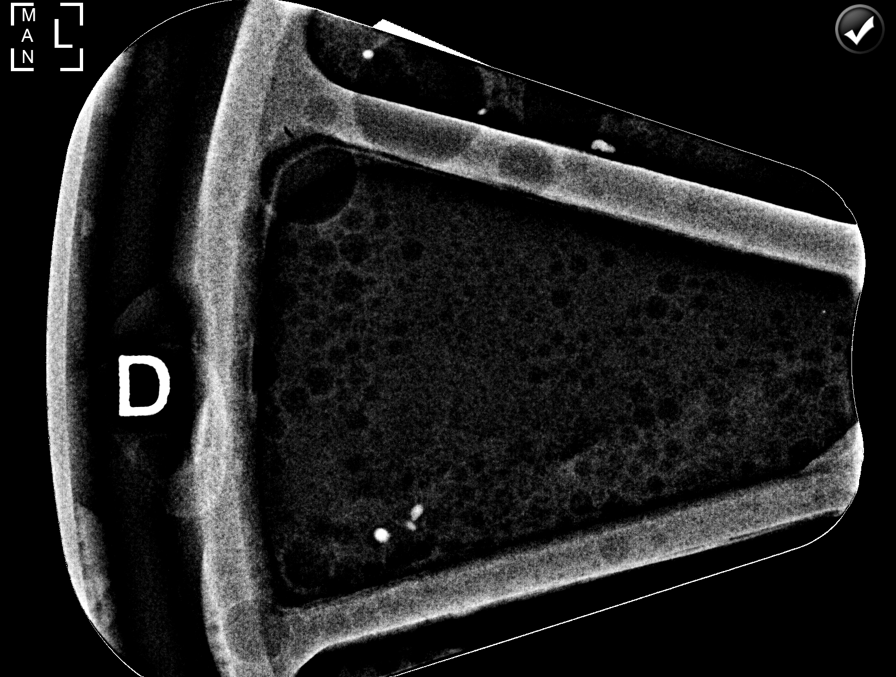

[L (2 of 7)]
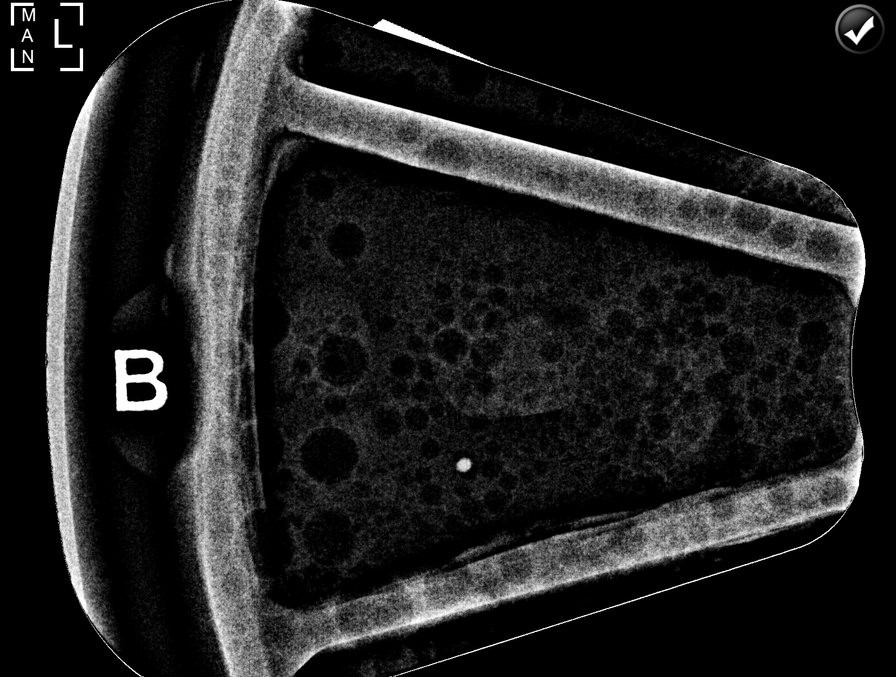

[L (3 of 7)]
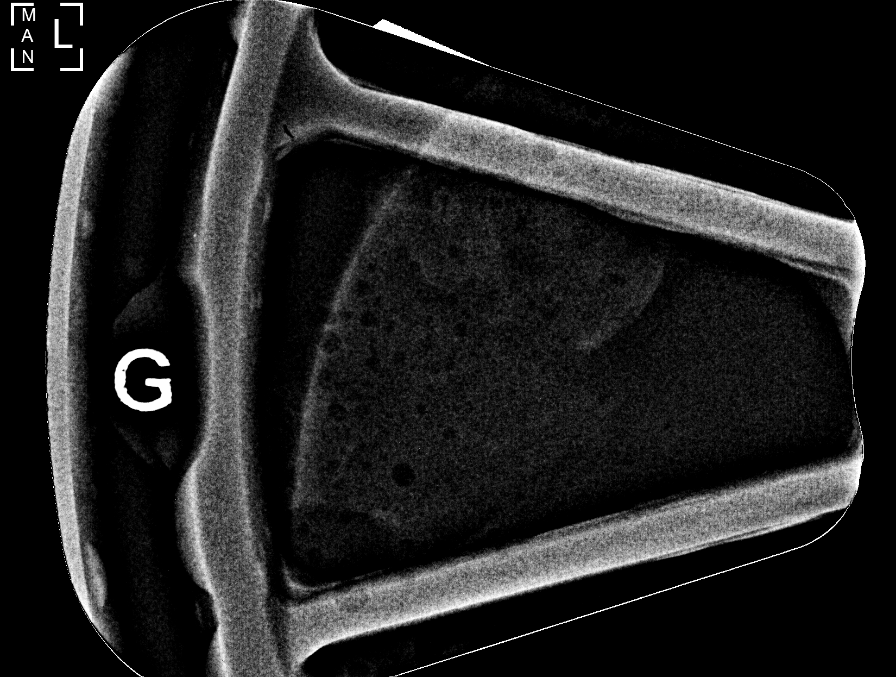

[L (4 of 7)]
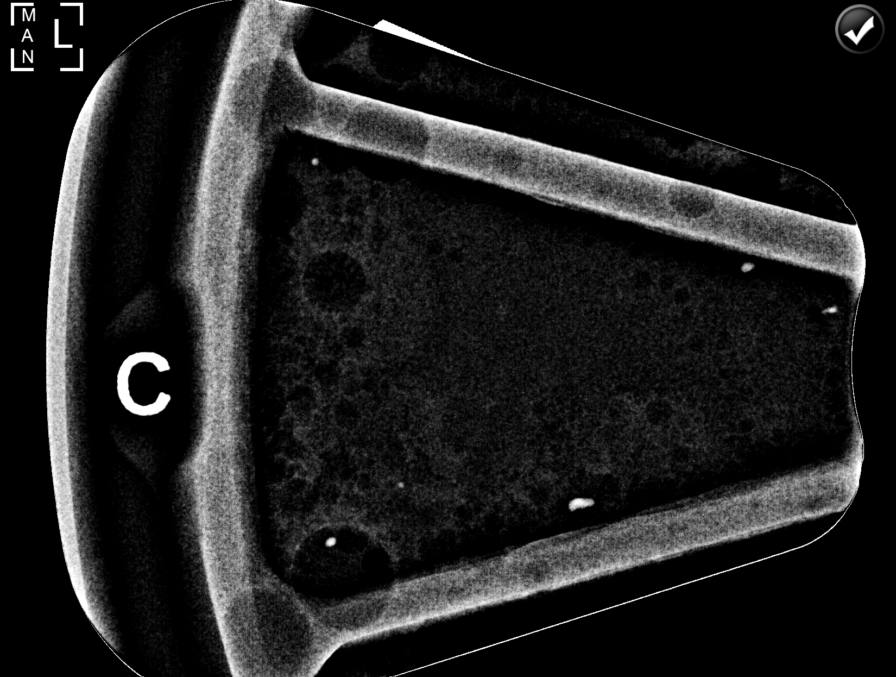

[L (5 of 7)]
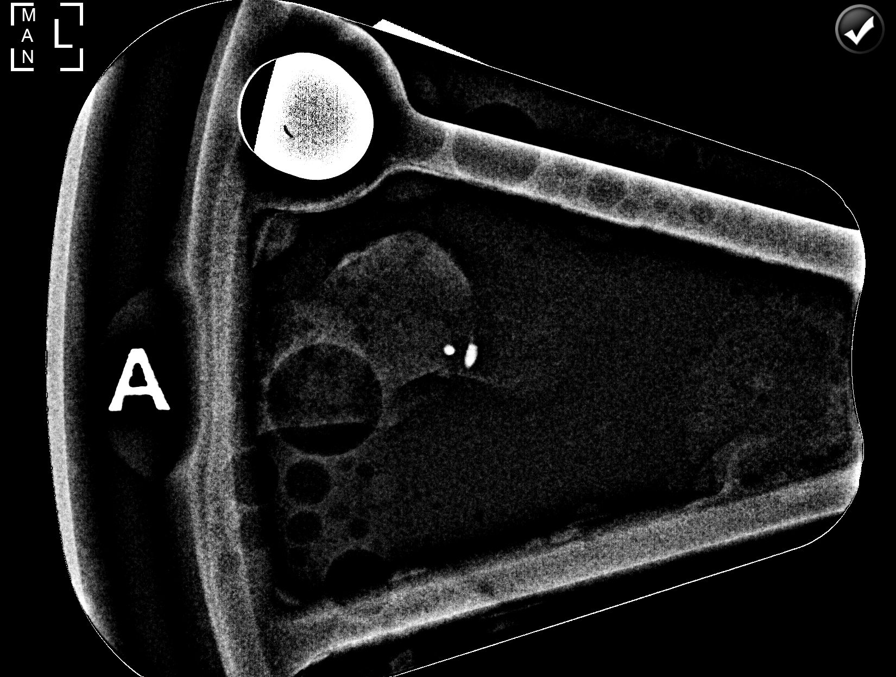

[L (6 of 7)]
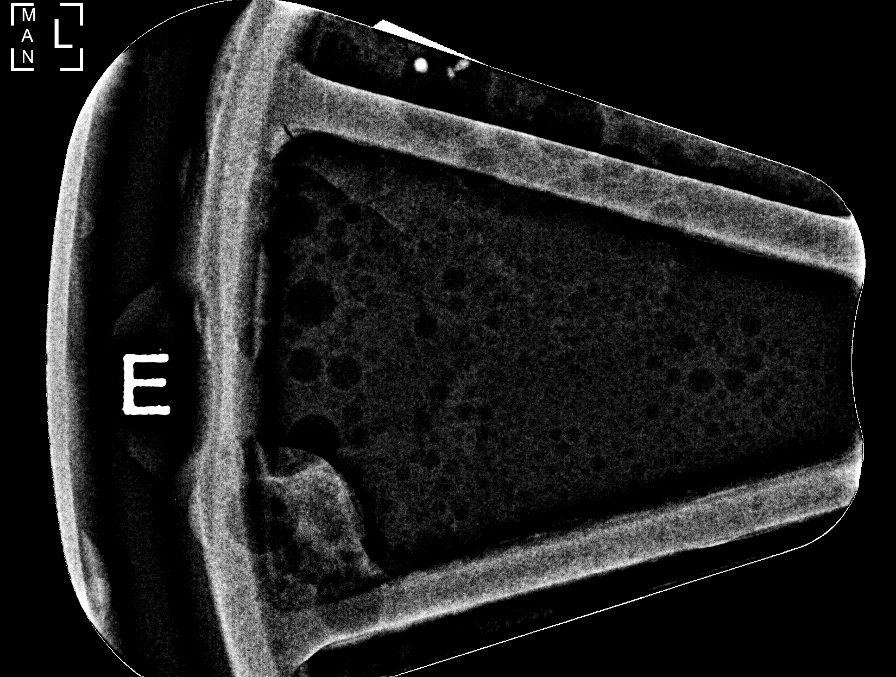

[L (7 of 7)]
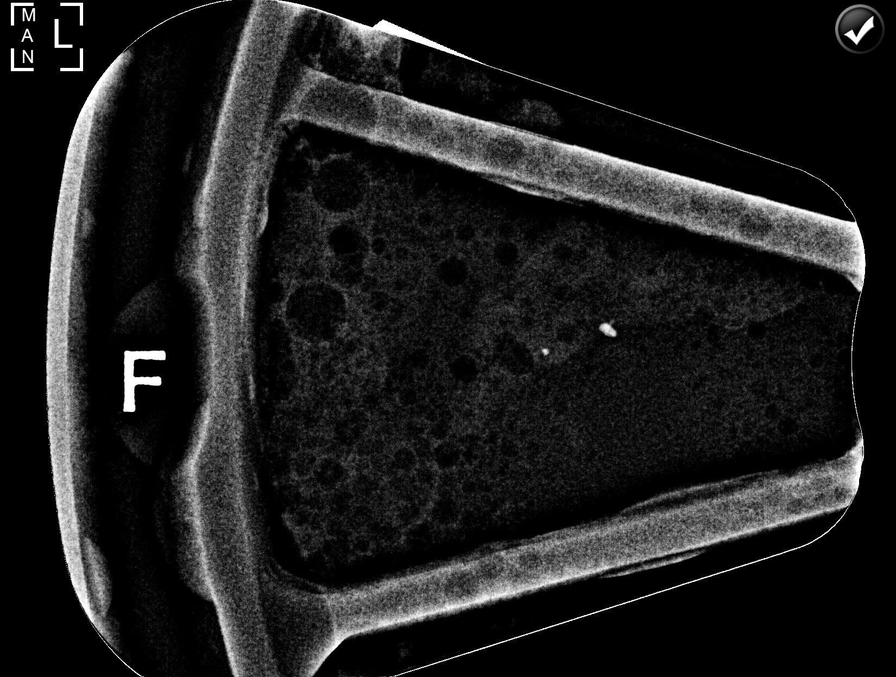

[L CC]
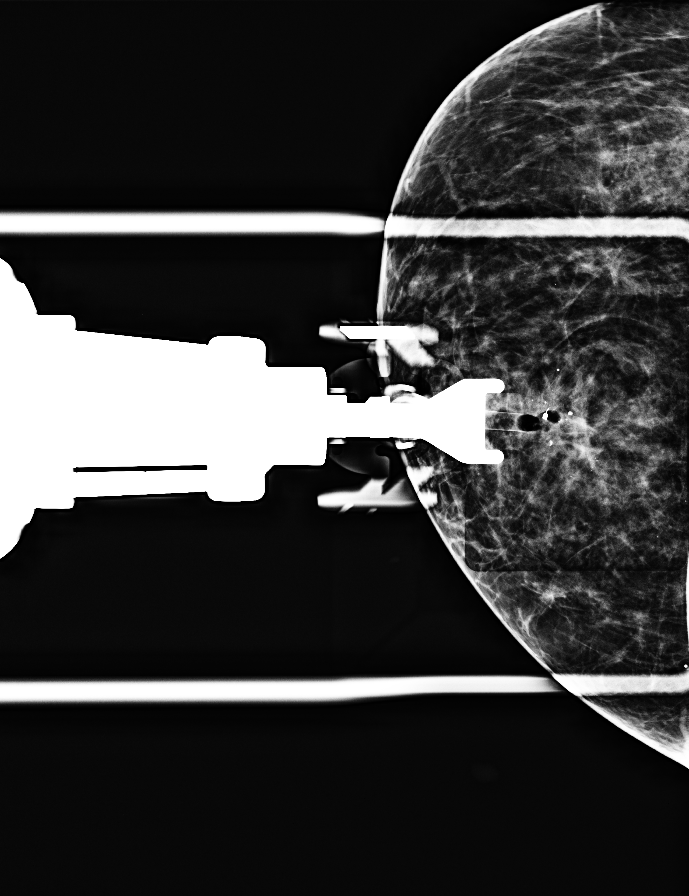

[8 of 18 positions shown; findings below may reference images not displayed]



Using sterile technique and 1% Lidocaine as local anesthetic, under
stereotactic guidance, a 9 gauge vacuum assisted device was used to
perform core needle biopsy of calcifications in the upper outer
quadrant of the left breast using a superior approach. Specimen
radiograph was performed showing multiple calcifications for which
biopsy was performed. Specimens with calcifications are identified
for pathology.

Lesion quadrant: Upper outer quadrant

At the conclusion of the procedure, a coil shaped tissue marker clip
was deployed into the biopsy cavity. Follow-up 2-view mammogram was
performed and dictated separately.
IMPRESSION: Stereotactic-guided biopsy of left breast calcifications. No
apparent complications.

ADDENDUM:
Pathology revealed COMPLEX SCLEROSING LESION WITH LOBULAR CARCINOMA
IN SITU of the Left breast, upper outer quadrant. The LCIS has
calcifications and pleomorphic features. This was found to be
concordant by Dr. Boudou Aradi, with excision recommended.

Pathology results were discussed with the patient by telephone by
Lussendie Maignant, RN, Nurse Navigator. The patient reported doing well
after the biopsy with tenderness at the site. Post biopsy
instructions and care were reviewed and questions were answered. The
patient was encouraged to call [REDACTED] for any additional concerns.

Surgical consultation has been arranged with Dr. Popko Resoort at
[REDACTED] on March 19, 2019.

Pathology results reported by Navid Gin, RN on 03/06/2019.



Using sterile technique and 1% Lidocaine as local anesthetic, under
stereotactic guidance, a 9 gauge vacuum assisted device was used to
perform core needle biopsy of calcifications in the upper outer
quadrant of the left breast using a superior approach. Specimen
radiograph was performed showing multiple calcifications for which
biopsy was performed. Specimens with calcifications are identified
for pathology.

Lesion quadrant: Upper outer quadrant

At the conclusion of the procedure, a coil shaped tissue marker clip
was deployed into the biopsy cavity. Follow-up 2-view mammogram was
performed and dictated separately.
IMPRESSION: Stereotactic-guided biopsy of left breast calcifications. No
apparent complications.

## 2019-10-14 DIAGNOSIS — Z9189 Other specified personal risk factors, not elsewhere classified: Secondary | ICD-10-CM | POA: Diagnosis not present

## 2020-01-03 DIAGNOSIS — M545 Low back pain: Secondary | ICD-10-CM | POA: Diagnosis not present

## 2020-01-03 DIAGNOSIS — M25551 Pain in right hip: Secondary | ICD-10-CM | POA: Diagnosis not present

## 2020-01-03 DIAGNOSIS — M25552 Pain in left hip: Secondary | ICD-10-CM | POA: Diagnosis not present

## 2020-01-13 DIAGNOSIS — D225 Melanocytic nevi of trunk: Secondary | ICD-10-CM | POA: Diagnosis not present

## 2020-01-13 DIAGNOSIS — Z85828 Personal history of other malignant neoplasm of skin: Secondary | ICD-10-CM | POA: Diagnosis not present

## 2020-01-13 DIAGNOSIS — L578 Other skin changes due to chronic exposure to nonionizing radiation: Secondary | ICD-10-CM | POA: Diagnosis not present

## 2020-01-13 DIAGNOSIS — L720 Epidermal cyst: Secondary | ICD-10-CM | POA: Diagnosis not present

## 2020-01-13 DIAGNOSIS — L814 Other melanin hyperpigmentation: Secondary | ICD-10-CM | POA: Diagnosis not present

## 2020-01-13 DIAGNOSIS — L821 Other seborrheic keratosis: Secondary | ICD-10-CM | POA: Diagnosis not present

## 2020-01-13 DIAGNOSIS — L570 Actinic keratosis: Secondary | ICD-10-CM | POA: Diagnosis not present

## 2020-01-13 DIAGNOSIS — L309 Dermatitis, unspecified: Secondary | ICD-10-CM | POA: Diagnosis not present

## 2020-01-14 DIAGNOSIS — M545 Low back pain: Secondary | ICD-10-CM | POA: Diagnosis not present

## 2020-01-17 DIAGNOSIS — M545 Low back pain: Secondary | ICD-10-CM | POA: Diagnosis not present

## 2020-01-20 DIAGNOSIS — M545 Low back pain: Secondary | ICD-10-CM | POA: Diagnosis not present

## 2020-01-23 DIAGNOSIS — M545 Low back pain: Secondary | ICD-10-CM | POA: Diagnosis not present

## 2020-02-25 ENCOUNTER — Other Ambulatory Visit: Payer: Self-pay | Admitting: Surgery

## 2020-02-25 DIAGNOSIS — Z9189 Other specified personal risk factors, not elsewhere classified: Secondary | ICD-10-CM

## 2020-02-25 DIAGNOSIS — Z1231 Encounter for screening mammogram for malignant neoplasm of breast: Secondary | ICD-10-CM

## 2020-02-28 DIAGNOSIS — M25551 Pain in right hip: Secondary | ICD-10-CM | POA: Diagnosis not present

## 2020-02-28 DIAGNOSIS — M25552 Pain in left hip: Secondary | ICD-10-CM | POA: Diagnosis not present

## 2020-02-28 DIAGNOSIS — M545 Low back pain: Secondary | ICD-10-CM | POA: Diagnosis not present

## 2020-03-10 ENCOUNTER — Ambulatory Visit
Admission: RE | Admit: 2020-03-10 | Discharge: 2020-03-10 | Disposition: A | Discharge status: Home / Self Care | Payer: Medicare Other | Source: Ambulatory Visit | Attending: Surgery | Admitting: Surgery

## 2020-03-10 ENCOUNTER — Other Ambulatory Visit: Payer: Self-pay

## 2020-03-10 DIAGNOSIS — Z1231 Encounter for screening mammogram for malignant neoplasm of breast: Secondary | ICD-10-CM

## 2020-03-13 ENCOUNTER — Other Ambulatory Visit: Payer: Self-pay | Admitting: Surgery

## 2020-03-13 DIAGNOSIS — R928 Other abnormal and inconclusive findings on diagnostic imaging of breast: Secondary | ICD-10-CM

## 2020-03-25 ENCOUNTER — Other Ambulatory Visit: Payer: Self-pay

## 2020-03-25 ENCOUNTER — Ambulatory Visit
Admission: RE | Admit: 2020-03-25 | Discharge: 2020-03-25 | Disposition: A | Discharge status: Home / Self Care | Payer: Medicare Other | Source: Ambulatory Visit | Attending: Surgery | Admitting: Surgery

## 2020-03-25 ENCOUNTER — Other Ambulatory Visit: Payer: Self-pay | Admitting: Surgery

## 2020-03-25 ENCOUNTER — Ambulatory Visit: Payer: Medicare Other

## 2020-03-25 DIAGNOSIS — N6320 Unspecified lump in the left breast, unspecified quadrant: Secondary | ICD-10-CM

## 2020-03-25 DIAGNOSIS — R922 Inconclusive mammogram: Secondary | ICD-10-CM | POA: Diagnosis not present

## 2020-03-25 DIAGNOSIS — R928 Other abnormal and inconclusive findings on diagnostic imaging of breast: Secondary | ICD-10-CM

## 2020-03-27 ENCOUNTER — Other Ambulatory Visit: Payer: Self-pay

## 2020-03-27 ENCOUNTER — Ambulatory Visit
Admission: RE | Admit: 2020-03-27 | Discharge: 2020-03-27 | Disposition: A | Discharge status: Home / Self Care | Payer: Medicare Other | Source: Ambulatory Visit | Attending: Surgery | Admitting: Surgery

## 2020-03-27 DIAGNOSIS — Z9189 Other specified personal risk factors, not elsewhere classified: Secondary | ICD-10-CM

## 2020-03-27 DIAGNOSIS — Z853 Personal history of malignant neoplasm of breast: Secondary | ICD-10-CM | POA: Diagnosis not present

## 2020-03-27 DIAGNOSIS — Z86018 Personal history of other benign neoplasm: Secondary | ICD-10-CM | POA: Diagnosis not present

## 2020-03-27 MED ORDER — GADOBUTROL 1 MMOL/ML IV SOLN
8.0000 mL | Freq: Once | INTRAVENOUS | Status: AC | PRN
  Administered 2020-03-27: 8 mL via INTRAVENOUS

## 2020-04-06 ENCOUNTER — Ambulatory Visit
Admission: RE | Admit: 2020-04-06 | Discharge: 2020-04-06 | Disposition: A | Discharge status: Home / Self Care | Payer: Medicare Other | Source: Ambulatory Visit | Attending: Surgery | Admitting: Surgery

## 2020-04-06 ENCOUNTER — Other Ambulatory Visit: Payer: Self-pay

## 2020-04-06 DIAGNOSIS — R928 Other abnormal and inconclusive findings on diagnostic imaging of breast: Secondary | ICD-10-CM

## 2020-04-06 DIAGNOSIS — N6489 Other specified disorders of breast: Secondary | ICD-10-CM | POA: Diagnosis not present

## 2020-04-08 ENCOUNTER — Other Ambulatory Visit: Payer: Self-pay | Admitting: Surgery

## 2020-04-08 DIAGNOSIS — R928 Other abnormal and inconclusive findings on diagnostic imaging of breast: Secondary | ICD-10-CM | POA: Diagnosis not present

## 2020-04-08 DIAGNOSIS — Z0000 Encounter for general adult medical examination without abnormal findings: Secondary | ICD-10-CM | POA: Diagnosis not present

## 2020-04-08 DIAGNOSIS — F419 Anxiety disorder, unspecified: Secondary | ICD-10-CM | POA: Diagnosis not present

## 2020-04-08 DIAGNOSIS — L570 Actinic keratosis: Secondary | ICD-10-CM | POA: Diagnosis not present

## 2020-04-08 DIAGNOSIS — E559 Vitamin D deficiency, unspecified: Secondary | ICD-10-CM | POA: Diagnosis not present

## 2020-04-08 DIAGNOSIS — E785 Hyperlipidemia, unspecified: Secondary | ICD-10-CM | POA: Diagnosis not present

## 2020-04-08 DIAGNOSIS — I700 Atherosclerosis of aorta: Secondary | ICD-10-CM | POA: Diagnosis not present

## 2020-04-08 DIAGNOSIS — G4700 Insomnia, unspecified: Secondary | ICD-10-CM | POA: Diagnosis not present

## 2020-04-08 DIAGNOSIS — I10 Essential (primary) hypertension: Secondary | ICD-10-CM | POA: Diagnosis not present

## 2020-04-22 DIAGNOSIS — H2513 Age-related nuclear cataract, bilateral: Secondary | ICD-10-CM | POA: Diagnosis not present

## 2020-04-22 DIAGNOSIS — H5203 Hypermetropia, bilateral: Secondary | ICD-10-CM | POA: Diagnosis not present

## 2020-05-15 ENCOUNTER — Other Ambulatory Visit: Payer: Self-pay

## 2020-05-15 ENCOUNTER — Other Ambulatory Visit: Payer: Self-pay | Admitting: Radiology

## 2020-05-15 ENCOUNTER — Ambulatory Visit
Admission: RE | Admit: 2020-05-15 | Discharge: 2020-05-15 | Disposition: A | Discharge status: Home / Self Care | Payer: Medicare Other | Source: Ambulatory Visit | Attending: Surgery | Admitting: Surgery

## 2020-05-15 DIAGNOSIS — R928 Other abnormal and inconclusive findings on diagnostic imaging of breast: Secondary | ICD-10-CM

## 2020-05-15 DIAGNOSIS — N62 Hypertrophy of breast: Secondary | ICD-10-CM | POA: Diagnosis not present

## 2020-05-15 DIAGNOSIS — N6021 Fibroadenosis of right breast: Secondary | ICD-10-CM | POA: Diagnosis not present

## 2020-05-15 MED ORDER — GADOBUTROL 1 MMOL/ML IV SOLN
8.0000 mL | Freq: Once | INTRAVENOUS | Status: AC | PRN
  Administered 2020-05-15: 8 mL via INTRAVENOUS

## 2020-05-24 DIAGNOSIS — Z23 Encounter for immunization: Secondary | ICD-10-CM | POA: Diagnosis not present

## 2020-05-28 ENCOUNTER — Ambulatory Visit
Admission: RE | Admit: 2020-05-28 | Discharge: 2020-05-28 | Disposition: A | Discharge status: Home / Self Care | Payer: Medicare Other | Source: Ambulatory Visit | Attending: Surgery | Admitting: Surgery

## 2020-05-28 ENCOUNTER — Other Ambulatory Visit: Payer: Self-pay | Admitting: Diagnostic Radiology

## 2020-05-28 ENCOUNTER — Other Ambulatory Visit: Payer: Self-pay

## 2020-05-28 DIAGNOSIS — R928 Other abnormal and inconclusive findings on diagnostic imaging of breast: Secondary | ICD-10-CM | POA: Diagnosis not present

## 2020-05-28 DIAGNOSIS — D0502 Lobular carcinoma in situ of left breast: Secondary | ICD-10-CM | POA: Diagnosis not present

## 2020-05-28 DIAGNOSIS — D0512 Intraductal carcinoma in situ of left breast: Secondary | ICD-10-CM | POA: Diagnosis not present

## 2020-05-28 MED ORDER — GADOBUTROL 1 MMOL/ML IV SOLN
7.0000 mL | Freq: Once | INTRAVENOUS | Status: AC | PRN
  Administered 2020-05-28: 7 mL via INTRAVENOUS

## 2020-05-29 ENCOUNTER — Other Ambulatory Visit: Payer: Medicare Other

## 2020-06-03 DIAGNOSIS — Z23 Encounter for immunization: Secondary | ICD-10-CM | POA: Diagnosis not present

## 2020-06-12 ENCOUNTER — Ambulatory Visit: Payer: Self-pay | Admitting: Surgery

## 2020-06-12 DIAGNOSIS — Z9189 Other specified personal risk factors, not elsewhere classified: Secondary | ICD-10-CM | POA: Diagnosis not present

## 2020-06-12 DIAGNOSIS — D0512 Intraductal carcinoma in situ of left breast: Secondary | ICD-10-CM | POA: Diagnosis not present

## 2020-06-12 NOTE — H&P (Signed)
Haley [name] Appointment: 06/12/2020 9:40 AM Location: [facility] Patient #: [id number] DOB: 1951-03-21 Married / Language: Cleophus Molt / Race: White Female  History of Present Illness Marcello Moores A. Kailei Cowens MD; 06/12/2020 1:12 PM) Patient words: Patient returns for follow-up after bilateral mastectomies with bilateral breast biopsies. She is extremity complex magnetic resonance imaging. 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 She has significant enhancement multiple quadrants in both breasts. She underwent 2 biopsies in the left one showing a complex sclerosing lesion with pleomorphic LCIS and complex sclerosing lesion. On the right side she was found to have a complex sclerosing lesion. She presents today for discussion. She is extremely dizzy magnetic resonance imaging making its use somewhat challenging. 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 She had LCIS last year diagnosed by lumpectomy.      ADDITIONAL INFORMATION: 1. PROGNOSTIC INDICATORS Results: IMMUNOHISTOCHEMICAL AND MORPHOMETRIC ANALYSIS PERFORMED MANUALLY Estrogen Receptor: 70%, POSITIVE, MODERATE STAINING INTENSITY Progesterone Receptor: 0%, NEGATIVE COMMENT: The negative hormone receptor study(ies) in this case has an internal positive control. REFERENCE RANGE ESTROGEN RECEPTOR NEGATIVE 0% POSITIVE =>1% REFERENCE RANGE PROGESTERONE RECEPTOR NEGATIVE 0% POSITIVE =>1% All controls stained appropriately Thressa Sheller MD Pathologist, Electronic Signature ( Signed 06/02/2020) FINAL DIAGNOSIS Diagnosis 1. 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 1  of 3 FINAL for Haley [name], Haley [name] [phone]) Diagnosis(continued) - CALCIFICATIONS PRESENT. 2. 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 - CALCIFICATIONS PRESENT. Microscopic Comment 1. Pancytokeratin highlights the epithelium. SMM and calponin are preserved. E-cadherin is present in areas of ductal carcinoma in situ and absent in areas of lobular carcinoma in situ. Prognostic markers will be ordered. Dr. Saralyn Pilar has reviewed the case. 2. The ductal carcinoma in situ is positive for E-cadherin and appears similar to the foci in part #1. Dr. Saralyn Pilar has reviewed the case. The case was called to [facility] on 06/01/2020. Vicente Males MD Pathologist, Electronic Signature (Case signed 06/01/2020)         69 year old female recently recalled from her screening mammogram on 03/10/2020 for a possible mass in the left breast. A persistent increased density at the lumpectomy site from 2009 in the upper inner left breast was noted, and recommended for stereotactic biopsy. The biopsy has not yet been performed.  The patient also has history of left breast atypia. On 04/03/2003, she had a left breast core needle biopsy showing atypical lobular hyperplasia. She had a left breast lumpectomy 09/14/2007 with pathology showing lobular carcinoma in situ. She had excisional biopsy of the left breast on 04/18/2019 showing a complex sclerosing lesion, and again with LCIS. She has had a remote right breast excisional biopsy for a fibroadenoma. No family history of breast cancer reported.  LABS: None.  EXAM: BILATERAL BREAST MRI WITH AND WITHOUT CONTRAST  TECHNIQUE: Multiplanar, multisequence MR images of both breasts were obtained prior to and following the intravenous administration of 8 ml of Gadavist  Three-dimensional MR images were rendered by post-processing of  the original MR data on an independent workstation. The three-dimensional MR images were interpreted, and findings are reported in the following complete MRI report for  this study. Three dimensional images were evaluated at the independent interpreting workstation using the DynaCAD thin client.  COMPARISON: Previous exam(s).  FINDINGS: Breast composition: c. Heterogeneous fibroglandular tissue.  Background parenchymal enhancement: Mild  Right breast: In the superior anterior right breast (series 6, images 74-80) extending into the lateral right breast to lower outer quadrant (series 6, images 87-94), there is a broad area of sheet-like clumped non mass enhancement. All together, the enhancement spans 9-10 cm.  Left breast: In the lower outer quadrant of the left breast, there is an area of clumped somewhat linear non mass enhancement (series 6, images 102 through 106) which spans approximately 6 cm. There are other areas of scattered clumped non mass enhancement extends superiorly, spanning approximately 4 cm in the craniocaudal dimension. Along the superior aspect of the clumped non mass enhancement, there is a 1.5 cm area of linear non mass enhancement in the upper-outer left breast in the middle to anterior depth (series 6, image 79).  No abnormal enhancement is seen at the old surgical site in the upper inner quadrant of the left breast, corresponding with the area of density seen on the patient's recent screening mammogram.  Lymph nodes: No abnormal appearing lymph nodes.  Ancillary findings: None.  IMPRESSION: 1. There is an indeterminate broad area of linear and clumped non mass enhancement in the lateral to lower outer quadrant of the left breast spanning approximately 6 cm.  2. There is an indeterminate 1.5 cm area of linear non mass enhancement in the upper-outer left breast.  3. No enhancement is identified at the lumpectomy site in the upper inner quadrant  of the left breast from 2009. This is the site of the increased density on the patient's recent diagnostic mammogram images.  4. There is an indeterminate broad sheet like area of non mass enhancement extending from the upper outer anterior right breast to the lower outer posterior right breast.  RECOMMENDATION: 1. The patient should proceed as previously recommended to have the stereotactic biopsy performed of the left breast.  2. A total of 4 MRI guided biopsies is recommended; 2 on the left and 2 on the right.  LEFT breast:  A. The 1.5 cm area of linear non mass enhancement in the upper-outer left breast (series 6, image 79).  [name]. The most prominent posterior portion of the linear non mass enhancement in the lower outer left breast.  RIGHT breast:  A. The linear non mass enhancement in the upper outer anterior right breast (series 6, image 76).  [name]. The posterior margin of the linear non mass enhancement in the lower outer right breast.  BI-RADS CATEGORY 4: Suspicious.   Electronically Signed By: Ammie Ferrier M.D. On: 03/27/2020 12:50.  The patient is a 69 year old female.   Problem List/Past Medical Sharyn Lull R. Brooks, CMA; 06/12/2020 9:59 AM) POST-OPERATIVE STATE (Z98.890) BREAST NEOPLASM, TIS (LCIS), LEFT (D05.02) RADIAL SCAR OF LEFT BREAST (N64.89) ENCOUNTER FOR REMOVAL OF SUTURES (Z48.02) BREAST MASS, RIGHT (N63.10) LEFT BREAST MASS (N63.20) AT HIGH RISK FOR BREAST CANCER (Z91.89)  Past Surgical History Sharyn Lull R. Brooks, CMA; 06/12/2020 9:59 AM) Breast Biopsy Left. Colon Polyp Removal - Colonoscopy Hip Surgery Right. Shoulder Surgery Right.  Diagnostic Studies History Sharyn Lull R. Brooks, CMA; 06/12/2020 9:59 AM) Colonoscopy 1-5 years ago Mammogram within last year Pap Smear 1-5 years ago  Allergies Sharyn Lull R. Brooks, CMA; 06/12/2020 9:59 AM) Penicillins  Medication History Sharyn Lull R. Brooks, CMA; 06/12/2020 9:59  AM) Vitamin D (Ergocalciferol) (1.25 MG(50000 UT) Capsule,  Oral) Active. Lunesta (1MG  Tablet, Oral) Active. Valsartan (320MG  Tablet, Oral) Active. Simvastatin (20MG  Tablet, Oral) Active. amLODIPine Besylate (5MG  Tablet, Oral) Active. Loratadine (Oral) Specific strength unknown - Active. Illusions C Breast Prosthesis (1 (one)) Active. (P2330 Silicone Breast Prosthesis - to restore balance and symmetry after breast surgery; QTY: 1 for single; Length of use: 2 years No refills ; L8000 Post-surgical bras - to hold breast prosthesis ; QTY: 6; Length of use:3 Months 3 Refills) Medications Reconciled  Social History Sharyn Lull R. Brooks, CMA; 06/12/2020 9:59 AM) Alcohol use Moderate alcohol use. Caffeine use Coffee. No drug use Tobacco use Former smoker.  Family History Sharyn Lull R. Brooks, CMA; 06/12/2020 9:59 AM) Anesthetic complications Father. Arthritis Mother. Hypertension Father, Mother. Respiratory Condition Mother.  Pregnancy / Birth History Sharyn Lull R. Rolena Infante, CMA; 06/12/2020 9:59 AM) Age at menarche 16 years. Age of menopause 51-55 Contraceptive History Oral contraceptives. Gravida 2 Maternal age 72-25 Para 2  Other Problems Sharyn Lull R. Brooks, CMA; 06/12/2020 9:59 AM) Anxiety Disorder High blood pressure Hypercholesterolemia    Vitals (Michelle R. Brooks CMA; 06/12/2020 9:59 AM) 06/12/2020 9:59 AM Weight: 183.5 lb Height: 68in Body Surface Area: 1.97 m Body Mass Index: 27.9 kg/m  Pulse: 107 (Regular)  BP: 148/88(Sitting, Left Arm, Standard)        Physical Exam (Edris Schneck A. Andera Cranmer MD; 06/12/2020 1:12 PM)  General Mental Status-Alert. General Appearance-Consistent with stated age. Hydration-Well hydrated. Voice-Normal.    Assessment & Plan (Inesha Sow A. Laisha Rau MD; 06/12/2020 1:14 PM)  AT HIGH RISK FOR BREAST CANCER (Z91.89)   BREAST NEOPLASM, TIS (DCIS), LEFT (D05.12) Impression: Given the very dense breast tissue  is multiple foci of LCIS and now DCIS on the left, we talked about lumpectomy at the minimum on the left. She does have another complex sclerosing lesion on the right. She has a history of multiple breast biopsies extremely dense tissue on magnetic resonance imaging. I had a lengthy discussion about risk reduction surgery to include bilateral simple mastectomy with or without reconstruction. We discussed role of sentinel lymph node mapping on the left. She is unclear direction she wants to take it once time to needs with a plastic surgeon to discuss reconstruction options and get back to me about final surgical plans. I discussed the pros and cons of an aggressive surgical approach versus a non-aggressive surgical portion pursued lumpectomy as well as well as recurrence rates and long-term expectations. Challenging her case is following her magnetic resonance imaging she has had multiple biopsies and will have multiple for the future given the complexity of her magnetic resonance imaging appearance and she is at significant high risk of progressing. Discussed treatment options for breast cancer to include breast conservation vs mastectomy with reconstruction. Pt has decided on mastectomy. Risk include bleeding, infection, flap necrosis, pain, numbness, recurrence, hematoma, other surgery needs. Pt understands and agrees to proceed.  Current Plans Pt Education - [facility] Free Text Education/Instructions: discussed with patient and provided information. Pt Education - [facility] Mastectomy HCI

## 2020-06-17 ENCOUNTER — Telehealth: Payer: Self-pay | Admitting: Licensed Clinical Social Worker

## 2020-06-17 ENCOUNTER — Ambulatory Visit: Payer: Self-pay | Admitting: Surgery

## 2020-06-17 DIAGNOSIS — C50912 Malignant neoplasm of unspecified site of left female breast: Secondary | ICD-10-CM

## 2020-06-17 NOTE — Telephone Encounter (Signed)
Received request from breast navigator to contact patient regarding support options. Limited options from [facility] as patient is not being seen here. CSW contacted patient and shared information regarding foundations and agencies that offer peer support programs. E-mailed information to patient as well as information on support group through [facility].   Christeen Douglas, LCSW

## 2020-07-16 ENCOUNTER — Encounter (HOSPITAL_BASED_OUTPATIENT_CLINIC_OR_DEPARTMENT_OTHER): Payer: Self-pay | Admitting: Surgery

## 2020-07-16 ENCOUNTER — Other Ambulatory Visit: Payer: Self-pay

## 2020-07-20 ENCOUNTER — Other Ambulatory Visit (HOSPITAL_COMMUNITY)
Admission: RE | Admit: 2020-07-20 | Discharge: 2020-07-20 | Disposition: A | Discharge status: Home / Self Care | Payer: Medicare Other | Source: Ambulatory Visit | Attending: Surgery | Admitting: Surgery

## 2020-07-20 ENCOUNTER — Encounter (HOSPITAL_BASED_OUTPATIENT_CLINIC_OR_DEPARTMENT_OTHER)
Admission: RE | Admit: 2020-07-20 | Discharge: 2020-07-20 | Disposition: A | Discharge status: Home / Self Care | Payer: Medicare Other | Source: Ambulatory Visit | Attending: Surgery | Admitting: Surgery

## 2020-07-20 DIAGNOSIS — Z01818 Encounter for other preprocedural examination: Secondary | ICD-10-CM | POA: Insufficient documentation

## 2020-07-20 DIAGNOSIS — Z20822 Contact with and (suspected) exposure to covid-19: Secondary | ICD-10-CM | POA: Insufficient documentation

## 2020-07-20 DIAGNOSIS — C50912 Malignant neoplasm of unspecified site of left female breast: Secondary | ICD-10-CM | POA: Insufficient documentation

## 2020-07-20 DIAGNOSIS — Z01812 Encounter for preprocedural laboratory examination: Secondary | ICD-10-CM | POA: Diagnosis not present

## 2020-07-20 LAB — CBC WITH DIFFERENTIAL/PLATELET
Abs Immature Granulocytes: 0.04 10*3/uL (ref 0.00–0.07)
Basophils Absolute: 0.1 10*3/uL (ref 0.0–0.1)
Basophils Relative: 1 %
Eosinophils Absolute: 0.1 10*3/uL (ref 0.0–0.5)
Eosinophils Relative: 2 %
HCT: 39.9 % (ref 36.0–46.0)
Hemoglobin: 13.3 g/dL (ref 12.0–15.0)
Immature Granulocytes: 1 %
Lymphocytes Relative: 19 %
Lymphs Abs: 1.1 10*3/uL (ref 0.7–4.0)
MCH: 31.3 pg (ref 26.0–34.0)
MCHC: 33.3 g/dL (ref 30.0–36.0)
MCV: 93.9 fL (ref 80.0–100.0)
Monocytes Absolute: 0.5 10*3/uL (ref 0.1–1.0)
Monocytes Relative: 8 %
Neutro Abs: 4.1 10*3/uL (ref 1.7–7.7)
Neutrophils Relative %: 69 %
Platelets: 188 10*3/uL (ref 150–400)
RBC: 4.25 MIL/uL (ref 3.87–5.11)
RDW: 13.4 % (ref 11.5–15.5)
WBC: 5.9 10*3/uL (ref 4.0–10.5)
nRBC: 0 % (ref 0.0–0.2)

## 2020-07-20 LAB — COMPREHENSIVE METABOLIC PANEL
ALT: 48 U/L — ABNORMAL HIGH (ref 0–44)
AST: 40 U/L (ref 15–41)
Albumin: 4.2 g/dL (ref 3.5–5.0)
Alkaline Phosphatase: 69 U/L (ref 38–126)
Anion gap: 12 (ref 5–15)
BUN: 13 mg/dL (ref 8–23)
CO2: 24 mmol/L (ref 22–32)
Calcium: 9.5 mg/dL (ref 8.9–10.3)
Chloride: 103 mmol/L (ref 98–111)
Creatinine, Ser: 0.74 mg/dL (ref 0.44–1.00)
GFR, Estimated: >60 mL/min (ref >60)
Glucose, Bld: 103 mg/dL — ABNORMAL HIGH (ref 70–99)
Potassium: 4.3 mmol/L (ref 3.5–5.1)
Sodium: 139 mmol/L (ref 135–145)
Total Bilirubin: 0.9 mg/dL (ref 0.3–1.2)
Total Protein: 6.8 g/dL (ref 6.5–8.1)

## 2020-07-20 NOTE — Progress Notes (Signed)

## 2020-07-21 LAB — SARS CORONAVIRUS 2 (TAT 6-24 HRS): SARS Coronavirus 2: NEGATIVE

## 2020-07-23 ENCOUNTER — Ambulatory Visit (HOSPITAL_BASED_OUTPATIENT_CLINIC_OR_DEPARTMENT_OTHER): Payer: Medicare Other | Admitting: Anesthesiology

## 2020-07-23 ENCOUNTER — Encounter (HOSPITAL_BASED_OUTPATIENT_CLINIC_OR_DEPARTMENT_OTHER)
Admission: RE | Disposition: A | Discharge status: Home / Self Care | Payer: Self-pay | Source: Home / Self Care | Attending: Surgery

## 2020-07-23 ENCOUNTER — Other Ambulatory Visit: Payer: Self-pay

## 2020-07-23 ENCOUNTER — Encounter (HOSPITAL_BASED_OUTPATIENT_CLINIC_OR_DEPARTMENT_OTHER): Payer: Self-pay | Admitting: Surgery

## 2020-07-23 ENCOUNTER — Encounter (HOSPITAL_COMMUNITY)
Admission: RE | Admit: 2020-07-23 | Discharge: 2020-07-23 | Disposition: A | Discharge status: Home / Self Care | Payer: Medicare Other | Source: Ambulatory Visit | Attending: Surgery | Admitting: Surgery

## 2020-07-23 ENCOUNTER — Ambulatory Visit (HOSPITAL_BASED_OUTPATIENT_CLINIC_OR_DEPARTMENT_OTHER)
Admission: RE | Admit: 2020-07-23 | Discharge: 2020-07-24 | Disposition: A | Discharge status: Home / Self Care | Payer: Medicare Other | Attending: Surgery | Admitting: Surgery

## 2020-07-23 DIAGNOSIS — R030 Elevated blood-pressure reading, without diagnosis of hypertension: Secondary | ICD-10-CM | POA: Diagnosis not present

## 2020-07-23 DIAGNOSIS — N6489 Other specified disorders of breast: Secondary | ICD-10-CM | POA: Diagnosis not present

## 2020-07-23 DIAGNOSIS — I10 Essential (primary) hypertension: Secondary | ICD-10-CM | POA: Insufficient documentation

## 2020-07-23 DIAGNOSIS — G8918 Other acute postprocedural pain: Secondary | ICD-10-CM | POA: Diagnosis not present

## 2020-07-23 DIAGNOSIS — D0512 Intraductal carcinoma in situ of left breast: Secondary | ICD-10-CM | POA: Insufficient documentation

## 2020-07-23 DIAGNOSIS — C50912 Malignant neoplasm of unspecified site of left female breast: Secondary | ICD-10-CM

## 2020-07-23 DIAGNOSIS — Z170 Estrogen receptor positive status [ER+]: Secondary | ICD-10-CM | POA: Insufficient documentation

## 2020-07-23 DIAGNOSIS — Z880 Allergy status to penicillin: Secondary | ICD-10-CM | POA: Insufficient documentation

## 2020-07-23 DIAGNOSIS — D0511 Intraductal carcinoma in situ of right breast: Secondary | ICD-10-CM | POA: Diagnosis not present

## 2020-07-23 DIAGNOSIS — Z79899 Other long term (current) drug therapy: Secondary | ICD-10-CM | POA: Insufficient documentation

## 2020-07-23 DIAGNOSIS — N6022 Fibroadenosis of left breast: Secondary | ICD-10-CM | POA: Diagnosis not present

## 2020-07-23 DIAGNOSIS — Z87891 Personal history of nicotine dependence: Secondary | ICD-10-CM | POA: Diagnosis not present

## 2020-07-23 DIAGNOSIS — Z8261 Family history of arthritis: Secondary | ICD-10-CM | POA: Insufficient documentation

## 2020-07-23 DIAGNOSIS — Z8249 Family history of ischemic heart disease and other diseases of the circulatory system: Secondary | ICD-10-CM | POA: Insufficient documentation

## 2020-07-23 DIAGNOSIS — M7061 Trochanteric bursitis, right hip: Secondary | ICD-10-CM | POA: Diagnosis not present

## 2020-07-23 HISTORY — DX: Gastro-esophageal reflux disease without esophagitis: K21.9

## 2020-07-23 HISTORY — PX: MASTECTOMY W/ SENTINEL NODE BIOPSY: SHX2001

## 2020-07-23 SURGERY — MASTECTOMY WITH SENTINEL LYMPH NODE BIOPSY
Anesthesia: General | Site: Breast | Laterality: Bilateral

## 2020-07-23 MED ORDER — MORPHINE SULFATE (PF) 4 MG/ML IV SOLN: 1.0000 mg | INTRAVENOUS | Status: DC | PRN

## 2020-07-23 MED ORDER — AMISULPRIDE (ANTIEMETIC) 5 MG/2ML IV SOLN
5.0000 mg | Freq: Once | INTRAVENOUS | Status: AC
  Administered 2020-07-23: 13:00:00 5 mg via INTRAVENOUS

## 2020-07-23 MED ORDER — ACETAMINOPHEN 325 MG PO TABS
650.0000 mg | ORAL_TABLET | ORAL | Status: DC | PRN
  Administered 2020-07-23 – 2020-07-24 (×3): 650 mg via ORAL
  Filled 2020-07-23 (×3): qty 2

## 2020-07-23 MED ORDER — PROPOFOL 10 MG/ML IV BOLUS
INTRAVENOUS | Status: AC
  Filled 2020-07-23: qty 20

## 2020-07-23 MED ORDER — FENTANYL CITRATE (PF) 100 MCG/2ML IJ SOLN
INTRAMUSCULAR | Status: AC
  Filled 2020-07-23: qty 2

## 2020-07-23 MED ORDER — TECHNETIUM TC 99M TILMANOCEPT KIT
1.0000 | PACK | Freq: Once | INTRAVENOUS | Status: AC | PRN
  Administered 2020-07-23: 1 via INTRADERMAL

## 2020-07-23 MED ORDER — DEXAMETHASONE SODIUM PHOSPHATE 10 MG/ML IJ SOLN
INTRAMUSCULAR | Status: AC
  Filled 2020-07-23: qty 1

## 2020-07-23 MED ORDER — PHENYLEPHRINE 40 MCG/ML (10ML) SYRINGE FOR IV PUSH (FOR BLOOD PRESSURE SUPPORT)
PREFILLED_SYRINGE | INTRAVENOUS | Status: DC | PRN
  Administered 2020-07-23 (×4): 120 ug via INTRAVENOUS

## 2020-07-23 MED ORDER — FENTANYL CITRATE (PF) 100 MCG/2ML IJ SOLN
INTRAMUSCULAR | Status: DC | PRN
  Administered 2020-07-23 (×5): 50 ug via INTRAVENOUS
  Administered 2020-07-23 (×2): 25 ug via INTRAVENOUS

## 2020-07-23 MED ORDER — SODIUM CHLORIDE 0.9% FLUSH: 3.0000 mL | INTRAVENOUS | Status: DC | PRN

## 2020-07-23 MED ORDER — ONDANSETRON HCL 4 MG/2ML IJ SOLN
INTRAMUSCULAR | Status: DC | PRN
  Administered 2020-07-23: 4 mg via INTRAVENOUS

## 2020-07-23 MED ORDER — HYDROMORPHONE HCL 1 MG/ML IJ SOLN
INTRAMUSCULAR | Status: AC
  Filled 2020-07-23: qty 0.5

## 2020-07-23 MED ORDER — LACTATED RINGERS IV SOLN: INTRAVENOUS | Status: DC

## 2020-07-23 MED ORDER — LIDOCAINE 2% (20 MG/ML) 5 ML SYRINGE
INTRAMUSCULAR | Status: DC | PRN
  Administered 2020-07-23: 40 mg via INTRAVENOUS

## 2020-07-23 MED ORDER — IRBESARTAN 75 MG PO TABS
37.5000 mg | ORAL_TABLET | Freq: Every day | ORAL | Status: DC
  Filled 2020-07-23: qty 0.5

## 2020-07-23 MED ORDER — MIDAZOLAM HCL 2 MG/2ML IJ SOLN
INTRAMUSCULAR | Status: AC
  Filled 2020-07-23: qty 2

## 2020-07-23 MED ORDER — OXYCODONE HCL 5 MG PO TABS: 5.0000 mg | ORAL_TABLET | Freq: Four times a day (QID) | ORAL | 0 refills | Status: DC | PRN

## 2020-07-23 MED ORDER — BUPIVACAINE HCL (PF) 0.25 % IJ SOLN
INTRAMUSCULAR | Status: DC | PRN
  Administered 2020-07-23 (×2): 25 mL

## 2020-07-23 MED ORDER — OXYCODONE HCL 5 MG PO TABS
5.0000 mg | ORAL_TABLET | ORAL | Status: DC | PRN
Start: 2020-07-23 — End: 2020-07-24
  Administered 2020-07-23: 5 mg via ORAL
  Filled 2020-07-23: qty 1

## 2020-07-23 MED ORDER — AMISULPRIDE (ANTIEMETIC) 5 MG/2ML IV SOLN
INTRAVENOUS | Status: AC
  Filled 2020-07-23: qty 2

## 2020-07-23 MED ORDER — CHLORHEXIDINE GLUCONATE CLOTH 2 % EX PADS: 6.0000 | MEDICATED_PAD | Freq: Once | CUTANEOUS | Status: DC

## 2020-07-23 MED ORDER — CIPROFLOXACIN IN D5W 400 MG/200ML IV SOLN
INTRAVENOUS | Status: AC
  Filled 2020-07-23: qty 200

## 2020-07-23 MED ORDER — PROPOFOL 10 MG/ML IV BOLUS
INTRAVENOUS | Status: DC | PRN
  Administered 2020-07-23: 120 mg via INTRAVENOUS

## 2020-07-23 MED ORDER — LIDOCAINE 2% (20 MG/ML) 5 ML SYRINGE
INTRAMUSCULAR | Status: AC
  Filled 2020-07-23: qty 5

## 2020-07-23 MED ORDER — ALBUMIN HUMAN 5 % IV SOLN: INTRAVENOUS | Status: DC | PRN

## 2020-07-23 MED ORDER — DEXTROSE-NACL 5-0.45 % IV SOLN: INTRAVENOUS | Status: DC

## 2020-07-23 MED ORDER — BUPIVACAINE LIPOSOME 1.3 % IJ SUSP
INTRAMUSCULAR | Status: DC | PRN
Start: 2020-07-23 — End: 2020-07-23
  Administered 2020-07-23 (×2): 5 mL

## 2020-07-23 MED ORDER — AMLODIPINE BESYLATE 5 MG PO TABS
5.0000 mg | ORAL_TABLET | Freq: Every day | ORAL | Status: DC
  Filled 2020-07-23: qty 1

## 2020-07-23 MED ORDER — DEXAMETHASONE SODIUM PHOSPHATE 10 MG/ML IJ SOLN
INTRAMUSCULAR | Status: DC | PRN
  Administered 2020-07-23: 4 mg via INTRAVENOUS

## 2020-07-23 MED ORDER — SODIUM CHLORIDE 0.9% FLUSH
3.0000 mL | Freq: Two times a day (BID) | INTRAVENOUS | Status: DC
  Administered 2020-07-23: 23:00:00 3 mL via INTRAVENOUS

## 2020-07-23 MED ORDER — HYDROMORPHONE HCL 1 MG/ML IJ SOLN
0.2500 mg | INTRAMUSCULAR | Status: DC | PRN
Start: 2020-07-23 — End: 2020-07-24
  Administered 2020-07-23: 0.5 mg via INTRAVENOUS

## 2020-07-23 MED ORDER — IBUPROFEN 800 MG PO TABS: 800.0000 mg | ORAL_TABLET | Freq: Three times a day (TID) | ORAL | 0 refills | Status: DC | PRN

## 2020-07-23 MED ORDER — FENTANYL CITRATE (PF) 100 MCG/2ML IJ SOLN
100.0000 ug | Freq: Once | INTRAMUSCULAR | Status: AC
Start: 2020-07-23 — End: 2020-07-23
  Administered 2020-07-23: 08:00:00 50 ug via INTRAVENOUS

## 2020-07-23 MED ORDER — ACETAMINOPHEN 325 MG RE SUPP: 650.0000 mg | RECTAL | Status: DC | PRN

## 2020-07-23 MED ORDER — PHENYLEPHRINE 40 MCG/ML (10ML) SYRINGE FOR IV PUSH (FOR BLOOD PRESSURE SUPPORT)
PREFILLED_SYRINGE | INTRAVENOUS | Status: AC
  Filled 2020-07-23: qty 10

## 2020-07-23 MED ORDER — ONDANSETRON HCL 4 MG/2ML IJ SOLN
INTRAMUSCULAR | Status: AC
  Filled 2020-07-23: qty 2

## 2020-07-23 MED ORDER — ZOLPIDEM TARTRATE 5 MG PO TABS: 5.0000 mg | ORAL_TABLET | Freq: Every evening | ORAL | Status: DC | PRN

## 2020-07-23 MED ORDER — ALPRAZOLAM 0.25 MG PO TABS: 0.2500 mg | ORAL_TABLET | Freq: Every day | ORAL | Status: DC | PRN

## 2020-07-23 MED ORDER — ALBUMIN HUMAN 5 % IV SOLN
INTRAVENOUS | Status: AC
  Filled 2020-07-23: qty 250

## 2020-07-23 MED ORDER — MIDAZOLAM HCL 2 MG/2ML IJ SOLN
2.0000 mg | Freq: Once | INTRAMUSCULAR | Status: AC
  Administered 2020-07-23: 08:00:00 1 mg via INTRAVENOUS

## 2020-07-23 MED ORDER — CIPROFLOXACIN IN D5W 400 MG/200ML IV SOLN
400.0000 mg | INTRAVENOUS | Status: AC
  Administered 2020-07-23: 10:00:00 400 mg via INTRAVENOUS

## 2020-07-23 MED ORDER — ONDANSETRON HCL 4 MG/2ML IJ SOLN: 4.0000 mg | INTRAMUSCULAR | Status: DC | PRN

## 2020-07-23 MED ORDER — SODIUM CHLORIDE 0.9 % IV SOLN: 250.0000 mL | INTRAVENOUS | Status: DC | PRN

## 2020-07-23 SURGICAL SUPPLY — 49 items
ADH SKN CLS APL DERMABOND .7 (GAUZE/BANDAGES/DRESSINGS) ×2
APL PRP STRL LF DISP 70% ISPRP (MISCELLANEOUS) ×2
APPLIER CLIP 9.375 MED OPEN (MISCELLANEOUS) ×2
APR CLP MED 9.3 20 MLT OPN (MISCELLANEOUS) ×1
BINDER BREAST XLRG (GAUZE/BANDAGES/DRESSINGS) ×1 IMPLANT
BINDER BREAST XXLRG (GAUZE/BANDAGES/DRESSINGS) IMPLANT
BIOPATCH RED 1 DISK 7.0 (GAUZE/BANDAGES/DRESSINGS) ×4 IMPLANT
BLADE HEX COATED 2.75 (ELECTRODE) ×2 IMPLANT
BLADE SURG 10 STRL SS (BLADE) ×2 IMPLANT
BLADE SURG 15 STRL LF DISP TIS (BLADE) ×1 IMPLANT
BLADE SURG 15 STRL SS (BLADE) ×2
CANISTER SUCT 1200ML W/VALVE (MISCELLANEOUS) ×2 IMPLANT
CHLORAPREP W/TINT 26 (MISCELLANEOUS) ×3 IMPLANT
CLIP APPLIE 9.375 MED OPEN (MISCELLANEOUS) ×1 IMPLANT
COVER BACK TABLE 60X90IN (DRAPES) ×2 IMPLANT
COVER MAYO STAND STRL (DRAPES) ×2 IMPLANT
COVER PROBE W GEL 5X96 (DRAPES) ×2 IMPLANT
DERMABOND ADVANCED (GAUZE/BANDAGES/DRESSINGS) ×2
DERMABOND ADVANCED .7 DNX12 (GAUZE/BANDAGES/DRESSINGS) ×2 IMPLANT
DRAIN CHANNEL 19F RND (DRAIN) ×5 IMPLANT
DRAPE LAPAROSCOPIC ABDOMINAL (DRAPES) ×2 IMPLANT
DRAPE UTILITY XL STRL (DRAPES) ×2 IMPLANT
DRSG PAD ABDOMINAL 8X10 ST (GAUZE/BANDAGES/DRESSINGS) ×2 IMPLANT
DRSG TEGADERM 2-3/8X2-3/4 SM (GAUZE/BANDAGES/DRESSINGS) ×4 IMPLANT
ELECT REM PT RETURN 9FT ADLT (ELECTROSURGICAL) ×2
ELECTRODE REM PT RTRN 9FT ADLT (ELECTROSURGICAL) ×1 IMPLANT
EVACUATOR SILICONE 100CC (DRAIN) ×5 IMPLANT
GLOVE BIO SURGEON STRL SZ 6.5 (GLOVE) ×2 IMPLANT
GLOVE ECLIPSE 8.0 STRL XLNG CF (GLOVE) ×2 IMPLANT
GLOVE SRG 8 PF TXTR STRL LF DI (GLOVE) ×1 IMPLANT
GLOVE SURG UNDER POLY LF SZ8 (GLOVE) ×2
GOWN STRL REUS W/ TWL LRG LVL3 (GOWN DISPOSABLE) ×2 IMPLANT
GOWN STRL REUS W/ TWL XL LVL3 (GOWN DISPOSABLE) ×1 IMPLANT
GOWN STRL REUS W/TWL LRG LVL3 (GOWN DISPOSABLE) ×4
GOWN STRL REUS W/TWL XL LVL3 (GOWN DISPOSABLE) ×2
HEMOSTAT ARISTA ABSORB 3G PWDR (HEMOSTASIS) ×4 IMPLANT
NS IRRIG 1000ML POUR BTL (IV SOLUTION) ×1 IMPLANT
PACK BASIN DAY SURGERY FS (CUSTOM PROCEDURE TRAY) ×2 IMPLANT
PENCIL SMOKE EVACUATOR (MISCELLANEOUS) ×2 IMPLANT
PIN SAFETY STERILE (MISCELLANEOUS) ×2 IMPLANT
SLEEVE SCD COMPRESS KNEE MED (MISCELLANEOUS) ×2 IMPLANT
SPONGE LAP 18X18 RF (DISPOSABLE) ×4 IMPLANT
SUT ETHILON 2 0 FS 18 (SUTURE) ×6 IMPLANT
SUT MON AB 4-0 PC3 18 (SUTURE) ×3 IMPLANT
SUT SILK 2 0 SH (SUTURE) ×1 IMPLANT
SUT VICRYL 3-0 CR8 SH (SUTURE) ×6 IMPLANT
TOWEL GREEN STERILE FF (TOWEL DISPOSABLE) ×4 IMPLANT
TUBE CONNECTING 20X1/4 (TUBING) ×2 IMPLANT
YANKAUER SUCT BULB TIP NO VENT (SUCTIONS) ×2 IMPLANT

## 2020-07-23 NOTE — Progress Notes (Signed)
Nuc med staff performed nuc med inj. No additional sedation needed. VSS. Emotional support provided

## 2020-07-23 NOTE — Anesthesia Preprocedure Evaluation (Addendum)
Anesthesia Evaluation  Patient identified by MRN, date of birth, ID band Patient awake    Reviewed: Allergy & Precautions, H&P , NPO status , Patient's Chart, lab work & pertinent test results  History of Anesthesia Complications (+) PONV  Airway Mallampati: II  TM Distance: >3 FB Neck ROM: Full    Dental no notable dental hx. (+) Teeth Intact, Dental Advisory Given   Pulmonary neg pulmonary ROS, former smoker,    Pulmonary exam normal breath sounds clear to auscultation       Cardiovascular hypertension,  Rhythm:Regular Rate:Normal     Neuro/Psych Anxiety negative neurological ROS     GI/Hepatic negative GI ROS, Neg liver ROS,   Endo/Other  negative endocrine ROS  Renal/GU negative Renal ROS  negative genitourinary   Musculoskeletal   Abdominal   Peds  Hematology negative hematology ROS (+)   Anesthesia Other Findings   Reproductive/Obstetrics negative OB ROS                            Anesthesia Physical Anesthesia Plan  ASA: II  Anesthesia Plan: General   Post-op Pain Management:  Regional for Post-op pain   Induction: Intravenous  PONV Risk Score and Plan: 4 or greater and Ondansetron, Dexamethasone, Midazolam and Scopolamine patch - Pre-op  Airway Management Planned: LMA  Additional Equipment:   Intra-op Plan:   Post-operative Plan: Extubation in OR  Informed Consent: I have reviewed the patients History and Physical, chart, labs and discussed the procedure including the risks, benefits and alternatives for the proposed anesthesia with the patient or authorized representative who has indicated his/her understanding and acceptance.     Dental advisory given  Plan Discussed with: CRNA  Anesthesia Plan Comments:         Anesthesia Quick Evaluation

## 2020-07-23 NOTE — Anesthesia Postprocedure Evaluation (Signed)
Anesthesia Post Note  Patient: Haley [name]  Procedure(s) Performed: BILATERAL SIMPLE MASTECTOMIES WITH LEFT SENTINEL LYMPH NODE MAPPING (Bilateral Breast)     Patient location during evaluation: PACU Anesthesia Type: General and Regional Level of consciousness: awake and alert Pain management: pain level controlled Vital Signs Assessment: post-procedure vital signs reviewed and stable Respiratory status: spontaneous breathing, nonlabored ventilation and respiratory function stable Cardiovascular status: blood pressure returned to baseline and stable Postop Assessment: no apparent nausea or vomiting Anesthetic complications: no   No complications documented.  Last Vitals:  Vitals:   07/23/20 1335 07/23/20 1345  BP:  (!) 114/58  Pulse: 76 80  Resp: 16 18  Temp:    SpO2: 95% 96%    Last Pain:  Vitals:   07/23/20 1345  TempSrc:   PainSc: (P) 0-No pain                 Alydia Gosser,W. EDMOND

## 2020-07-23 NOTE — Op Note (Signed)
Preoperative diagnosis: Left breast DCIS and right breast LCIS with lifetime risk of breast cancer over 20% postop diagnosis: Same  Procedure: Left simple mastectomy with left axillary sentinel lymph node mapping and right risk reducing mastectomy  Surgeon: Erroll Luna, MD  Anesthesia: General with bilateral pectoral block  EBL: 50 cc  Drains: 2 round Jackson-Pratt drains to both sides for total of 4  Specimen: Bilateral breast with left axillary contents consisting of 1 left axillary sentinel node to pathology  IV fluids: Per anesthesia record  Indications for procedure: The patient presents for bilateral simple mastectomy due to left breast DCIS, right breast LCIS, elevated lifetime risk of breast cancer of 20% and very dense breast tissue.  She had multiple core biopsies and lumpectomies.  She does have findings worrisome for increased risk as well as DCIS now on the left side presenting.  She had multifocal DCIS which made breast conserving surgery little more challenging but after lengthy discussion she opted for bilateral simple mastectomy without reconstruction after weighing all of her options of breast conservation versus mastectomy.  Recommend a left axillary sentinel lymph node mapping due to DCIS on the left.The surgical and non surgical options have been discussed with the patient.  Risks of surgery include bleeding,  Infection,  Flap necrosis,  Tissue loss,  Chronic pain, death, Numbness,  And the need for additional procedures.  Reconstruction options also have been discussed with the patient as well.  The patient agrees to proceed.Sentinel lymph node mapping and dissection has been discussed with the patient.  Risk of bleeding,  Infection,  Seroma formation,  Additional procedures,,  Shoulder weakness ,  Shoulder stiffness,  Nerve and blood vessel injury and reaction to the mapping dyes have been discussed.  Alternatives to surgery have been discussed with the patient.  The patient  agrees to proceed.   Description of procedure: The patient was met in the holding area and questions were answered.  She underwent pectoral block by anesthesia.  She underwent left breast injection of technetium sulfur colloid per protocol.  All questions were answered.  She was then taken back to the abdomen.  She is placed supine upon the OR table.  After induction of general esthesia both breasts were prepped and draped in sterile fashion and timeout performed.  Proper patient, site and procedure were verified as well as antibiotic administration.  The left side was done first.  Curvilinear incision was made above and below the nipple areolar complex.  She had significant excessive skin noted.  Superior skin flap was taken with cautery up to the clavicle.  These take the midline down to the sternum and then inferiorly to the inferior mammary fold.  Laterally we dissected lateral until the latissimus muscle was visualized.  We then dissected the breast in a medial to lateral fashion of the chest wall using cautery to include the nipple were complex.  Once the lateral attachments were encountered they were divided.  Neoprobe was used and a hot sentinel node was identified in left axilla and this was removed which was a level 1 lymph node.  Background counts approached baseline.  Irrigation was used.  Hemostasis achieved with cautery.  Through 2 separate stab incisions 19 round Blake drains were placed and secured to the skin with 2-0 nylon.  Arista was applied liberally to the wound for good additional hemostasis.  Wound was then closed with a deep layer of 3-0 Vicryl and subsequent 4-0 Monocryl stitch.  Right side was done in  similar fashion.  Curvilinear incision was made above and below the nipple areolar complex.  Superior skin flap taken to the clavicle, medially to the sternum and then inferiorly to the inferior mammary fold.  Laterally we dissected until we did identified the latissimus muscle.  The  breast was then dissected off the chest wall to include the pectoral fascia in a medial to lateral fashion to the lateral attachments were encountered and divided with cautery.  Hemostasis was achieved with cautery.  Irrigation was used and's was hemostatic.  219 round drains were placed to the inferior skin flap and secured with 2-0 nylon.  Arista was applied to the wound liberally as well.  Was then closed with 3-0 Vicryl and 4-0 Monocryl.  We placed Dermabond on around both incisions.  All drains were placed to bulb suction.  Breast binder placed.  All counts were found to be correct.  The patient was awoke extubated taken to recovery in satisfactory condition.

## 2020-07-23 NOTE — H&P (Addendum)
Haley [name]  Location: [facility] Patient #: [id number] DOB: [birth year] Married / Language: English / Race: White Female  History of Present Illness (Patient words: Patient returns for follow-up after bilateral mastectomies with bilateral breast biopsies. She is extremity complex magnetic resonance imaging. 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 She has significant enhancement multiple quadrants in both breasts. She underwent 2 biopsies in the left one showing a complex sclerosing lesion with pleomorphic LCIS and complex sclerosing lesion. On the right side she was found to have a complex sclerosing lesion. She presents today for discussion. She is extremely dizzy magnetic resonance imaging making its use somewhat challenging. 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 She had LCIS last year diagnosed by lumpectomy.      ADDITIONAL INFORMATION: 1. PROGNOSTIC INDICATORS Results: IMMUNOHISTOCHEMICAL AND MORPHOMETRIC ANALYSIS PERFORMED MANUALLY Estrogen Receptor: 70%, POSITIVE, MODERATE STAINING INTENSITY Progesterone Receptor: 0%, NEGATIVE COMMENT: The negative hormone receptor study(ies) in this case has an internal positive control. REFERENCE RANGE ESTROGEN RECEPTOR NEGATIVE 0% POSITIVE =>1% REFERENCE RANGE PROGESTERONE RECEPTOR NEGATIVE 0% POSITIVE =>1% All controls stained appropriately Thressa Sheller MD Pathologist, Electronic Signature ( Signed 06/02/2020) FINAL DIAGNOSIS Diagnosis 1. 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 1 of 3 FINAL for [name], [name]  [phone]) Diagnosis(continued) - CALCIFICATIONS PRESENT. 2. 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 - CALCIFICATIONS PRESENT. Microscopic Comment 1. Pancytokeratin highlights the epithelium. SMM and calponin are preserved. E-cadherin is present in areas of ductal carcinoma in situ and absent in areas of lobular carcinoma in situ. Prognostic markers will be ordered. Dr. Saralyn Pilar has reviewed the case. 2. The ductal carcinoma in situ is positive for E-cadherin and appears similar to the foci in part #1. Dr. Saralyn Pilar has reviewed the case. The case was called to [facility] on 06/01/2020. Vicente Males MD Pathologist, Electronic Signature (Case signed 06/01/2020)         69 year old female recently recalled from her screening mammogram on 03/10/2020 for a possible mass in the left breast. A persistent increased density at the lumpectomy site from 2009 in the upper inner left breast was noted, and recommended for stereotactic biopsy. The biopsy has not yet been performed.  The patient also has history of left breast atypia. On 04/03/2003, she had a left breast core needle biopsy showing atypical lobular hyperplasia. She had a left breast lumpectomy 09/14/2007 with pathology showing lobular carcinoma in situ. She had excisional biopsy of the left breast on 04/18/2019 showing a complex sclerosing lesion, and again with LCIS. She has had a remote right breast excisional biopsy for a fibroadenoma. No family history of breast cancer reported.  LABS: None.  EXAM: BILATERAL BREAST MRI WITH AND WITHOUT CONTRAST  TECHNIQUE: Multiplanar, multisequence MR images of both breasts were obtained prior to and following the intravenous administration of 8 ml of Gadavist  Three-dimensional MR images were rendered by post-processing of the original MR data on an  independent workstation. The three-dimensional MR images were interpreted, and findings are reported in the following complete MRI report for this study. Three dimensional images were evaluated at the independent  interpreting workstation using the DynaCAD thin client.  COMPARISON: Previous exam(s).  FINDINGS: Breast composition: c. Heterogeneous fibroglandular tissue.  Background parenchymal enhancement: Mild  Right breast: In the superior anterior right breast (series 6, images 74-80) extending into the lateral right breast to lower outer quadrant (series 6, images 87-94), there is a broad area of sheet-like clumped non mass enhancement. All together, the enhancement spans 9-10 cm.  Left breast: In the lower outer quadrant of the left breast, there is an area of clumped somewhat linear non mass enhancement (series 6, images 102 through 106) which spans approximately 6 cm. There are other areas of scattered clumped non mass enhancement extends superiorly, spanning approximately 4 cm in the craniocaudal dimension. Along the superior aspect of the clumped non mass enhancement, there is a 1.5 cm area of linear non mass enhancement in the upper-outer left breast in the middle to anterior depth (series 6, image 79).  No abnormal enhancement is seen at the old surgical site in the upper inner quadrant of the left breast, corresponding with the area of density seen on the patient's recent screening mammogram.  Lymph nodes: No abnormal appearing lymph nodes.  Ancillary findings: None.  IMPRESSION: 1. There is an indeterminate broad area of linear and clumped non mass enhancement in the lateral to lower outer quadrant of the left breast spanning approximately 6 cm.  2. There is an indeterminate 1.5 cm area of linear non mass enhancement in the upper-outer left breast.  3. No enhancement is identified at the lumpectomy site in the upper inner quadrant of the left  breast from 2009. This is the site of the increased density on the patient's recent diagnostic mammogram images.  4. There is an indeterminate broad sheet like area of non mass enhancement extending from the upper outer anterior right breast to the lower outer posterior right breast.  RECOMMENDATION: 1. The patient should proceed as previously recommended to have the stereotactic biopsy performed of the left breast.  2. A total of 4 MRI guided biopsies is recommended; 2 on the left and 2 on the right.  LEFT breast:  A. The 1.5 cm area of linear non mass enhancement in the upper-outer left breast (series 6, image 79).  B. The most prominent posterior portion of the linear non mass enhancement in the lower outer left breast.  RIGHT breast:  A. The linear non mass enhancement in the upper outer anterior right breast (series 6, image 76).  B. The posterior margin of the linear non mass enhancement in the lower outer right breast.  BI-RADS CATEGORY 4: Suspicious.   Electronically Signed By: Ammie Ferrier M.D. On: 03/27/2020 12:50.  The patient is a 69 year old female.   Problem List/Past Medical  9:59 AM) POST-OPERATIVE STATE (Z98.890) BREAST NEOPLASM, TIS (LCIS), LEFT (D05.02) RADIAL SCAR OF LEFT BREAST (N64.89) ENCOUNTER FOR REMOVAL OF SUTURES (Z48.02) BREAST MASS, RIGHT (N63.10) LEFT BREAST MASS (N63.20) AT HIGH RISK FOR BREAST CANCER (Z91.89)  Past Surgical History Breast Biopsy Left. Colon Polyp Removal - Colonoscopy Hip Surgery Right. Shoulder Surgery Right.  Diagnostic Studies History9:59 AM) Colonoscopy 1-5 years ago Mammogram within last year Pap Smear 1-5 years ago  Allergies ( Penicillins  Medication History Vitamin D (Ergocalciferol) (1.25 MG(50000 UT) Capsule, Oral) Active. Lunesta (1MG  Tablet, Oral) Active. Valsartan (320MG  Tablet, Oral) Active. Simvastatin (20MG  Tablet, Oral) Active. amLODIPine  Besylate (5MG  Tablet, Oral) Active. Loratadine (Oral) Specific strength unknown - Active. Illusions C Breast Prosthesis (1 (one)) Active. (Y6599 Silicone Breast  Prosthesis - to restore balance and symmetry after breast surgery; QTY: 1 for single; Length of use: 2 years No refills ; L8000 Post-surgical bras - to hold breast prosthesis ; QTY: 6; Length of use:3 Months 3 Refills) Medications Reconciled  Social History  Alcohol use Moderate alcohol use. Caffeine use Coffee. No drug use Tobacco use Former smoker.  Family History Anesthetic complications Father. Arthritis Mother. Hypertension Father, Mother. Respiratory Condition Mother.  Pregnancy / Birth History  AM) Age at menarche 40 years. Age of menopause 51-55 Contraceptive History Oral contraceptives. Gravida 2 Maternal age 44-25 Para 2  Other Problems Anxiety Disorder High blood pressure Hypercholesterolemia    Vitals  06/12/2020 9:59 AM Weight: 183.5 lb Height: 68in Body Surface Area: 1.97 m Body Mass Index: 27.9 kg/m  Pulse: 107 (Regular)  BP: 148/88(Sitting, Left Arm, Standard)        Physical Exam   General Mental Status-Alert. General Appearance-Consistent with stated age. Hydration-Well hydrated. Voice-Normal.  Breast : scars noted no masses bilaterally   CV  RRR Pulm: CTA  No axillary LA   Assessment & Plan   AT HIGH RISK FOR BREAST CANCER (Z91.89)   BREAST NEOPLASM, TIS (DCIS), LEFT (D05.12) Impression: Given the very dense breast tissue is multiple foci of LCIS and now DCIS on the left, we talked about lumpectomy at the minimum on the left. She does have another complex sclerosing lesion on the right. She has a history of multiple breast biopsies extremely dense tissue on magnetic resonance imaging. I had a lengthy discussion about risk reduction surgery to include bilateral simple mastectomy with or without reconstruction. We  discussed role of sentinel lymph node mapping on the left. She is unclear direction she wants to take it once time to needs with a plastic surgeon to discuss reconstruction options and get back to me about final surgical plans. I discussed the pros and cons of an aggressive surgical approach versus a non-aggressive surgical portion pursued lumpectomy as well as well as recurrence rates and long-term expectations. Challenging her case is following her magnetic resonance imaging she has had multiple biopsies and will have multiple for the future given the complexity of her magnetic resonance imaging appearance and she is at significant high risk of progressing. Discussed treatment options for breast cancer/ risk reduction surgery  to include breast conservation vs mastectomy with reconstruction. Pt has decided on  Bilateral mastectomy. Risk include bleeding, infection, flap necrosis, pain, numbness, recurrence, hematoma, other surgery needs. Pt understands and agrees to proceed.Sentinel lymph node mapping and dissection has been discussed with the patient.  Risk of bleeding,  Infection,  Seroma formation,  Additional procedures,,  Shoulder weakness ,  Shoulder stiffness,  Nerve and blood vessel injury and reaction to the mapping dyes have been discussed.  Alternatives to surgery have been discussed with the patient.  The patient agrees to proceed.  Current Plans Pt Education - [facility] Free Text Education/Instructions: discussed with patient and provided information. Pt Education - [facility] Mastectomy HCI

## 2020-07-23 NOTE — Progress Notes (Signed)
Assisted Dr. Oren Bracket with right and left, ultrasound guided, pectoralis block. Side rails up, monitors on through andt procedure. See vital signs in flow sheet. Tolerated Procedure well.

## 2020-07-23 NOTE — Discharge Instructions (Signed)
Post Anesthesia Home Care Instructions  Activity: Get plenty of rest for the remainder of the day. A responsible individual must stay with you for 24 hours following the procedure.  For the next 24 hours, DO NOT: -Drive a car -Paediatric nurse -Drink alcoholic beverages -Take any medication unless instructed by your physician -Make any legal decisions or sign important papers.  Meals: Start with liquid foods such as gelatin or soup. Progress to regular foods as tolerated. Avoid greasy, spicy, heavy foods. If nausea and/or vomiting occur, drink only clear liquids until the nausea and/or vomiting subsides. Call your physician if vomiting continues.  Special Instructions/Symptoms: Your throat may feel dry or sore from the anesthesia or the breathing tube placed in your throat during surgery. If this causes discomfort, gargle with warm salt water. The discomfort should disappear within 24 hours.  If you had a scopolamine patch placed behind your ear for the management of post- operative nausea and/or vomiting:  1. The medication in the patch is effective for 72 hours, after which it should be removed.  Wrap patch in a tissue and discard in the trash. Wash hands thoroughly with soap and water. 2. You may remove the patch earlier than 72 hours if you experience unpleasant side effects which may include dry mouth, dizziness or visual disturbances. 3. Avoid touching the patch. Wash your hands with soap and water after contact with the patch.        JP Drain [employer] this sheet to all of your post-operative appointments while you have your drains.  Please measure your drains by CC's or ML's.  Make sure you drain and measure your JP Drains 2 or 3 times per day.  At the end of each day, add up totals for the left side and add up totals for the right side.    ( 9 am )     ( 3 pm )        ( 9 pm )                Date L  R  L  R  L  R  Total L/R                                                                                                                                                                                        Information for Discharge Teaching: EXPAREL (bupivacaine liposome injectable suspension)   Your surgeon or anesthesiologist gave you EXPAREL(bupivacaine) to help control your pain after surgery.   EXPAREL is a local anesthetic that provides pain relief by numbing the tissue around the surgical site.  EXPAREL is  designed to release pain medication over time and can control pain for up to 72 hours.  Depending on how you respond to EXPAREL, you may require less pain medication during your recovery.  Possible side effects:  Temporary loss of sensation or ability to move in the area where bupivacaine was injected.  Nausea, vomiting, constipation  Rarely, numbness and tingling in your mouth or lips, lightheadedness, or anxiety may occur.  Call your doctor right away if you think you may be experiencing any of these sensations, or if you have other questions regarding possible side effects.  Follow all other discharge instructions given to you by your surgeon or nurse. Eat a healthy diet and drink plenty of water or other fluids.  If you return to the hospital for any reason within 96 hours following the administration of EXPAREL, it is important for health care providers to know that you have received this anesthetic. A teal colored band has been placed on your arm with the date, time and amount of EXPAREL you have received in order to alert and inform your health care providers. Please leave this armband in place for the full 96 hours following administration, and then you may remove the band.[facility] [phone]  MASTECTOMY: POST OP INSTRUCTIONS  Always review your discharge instruction sheet given to you by the facility where your surgery was performed. IF YOU HAVE DISABILITY OR FAMILY LEAVE FORMS, YOU MUST  BRING THEM TO THE OFFICE FOR PROCESSING.   DO NOT GIVE THEM TO YOUR DOCTOR. A prescription for pain medication may be given to you upon discharge.  Take your pain medication as prescribed, if needed.  If narcotic pain medicine is not needed, then you may take acetaminophen (Tylenol) or ibuprofen (Advil) as needed. 1. Take your usually prescribed medications unless otherwise directed. 2. If you need a refill on your pain medication, please contact your pharmacy.  They will contact our office to request authorization.  Prescriptions will not be filled after 5pm or on week-ends. 3. You should follow a light diet the first few days after arrival home, such as soup and crackers, etc.  Resume your normal diet the day after surgery. 4. Most patients will experience some swelling and bruising on the chest and underarm.  Ice packs will help.  Swelling and bruising can take several days to resolve.  5. It is common to experience some constipation if taking pain medication after surgery.  Increasing fluid intake and taking a stool softener (such as Colace) will usually help or prevent this problem from occurring.  A mild laxative (Milk of Magnesia or Miralax) should be taken according to package instructions if there are no bowel movements after 48 hours. 6. Unless discharge instructions indicate otherwise, leave your bandage dry and in place until your next appointment in 3-5 days.  You may take a limited sponge bath.  No tube baths or showers until the drains are removed.  You may have steri-strips (small skin tapes) in place directly over the incision.  These strips should be left on the skin for 7-10 days.  If your surgeon used skin glue on the incision, you may shower in 24 hours.  The glue will flake off over the next 2-3 weeks.  Any sutures or staples will be removed at the office during your follow-up visit. 7. DRAINS:  If you have drains in place, it is important to keep a list of the amount of drainage  produced each day in your drains.  Before leaving the hospital, you should be instructed on drain care.  Call our office if you have any questions about your drains. 8. ACTIVITIES:  You may resume regular (light) daily activities beginning the next day--such as daily self-care, walking, climbing stairs--gradually increasing activities as tolerated.  You may have sexual intercourse when it is comfortable.  Refrain from any heavy lifting or straining until approved by your doctor. a. You may drive when you are no longer taking prescription pain medication, you can comfortably wear a seatbelt, and you can safely maneuver your car and apply brakes. b. RETURN TO WORK:  __________________________________________________________ 9. You should see your doctor in the office for a follow-up appointment approximately 3-5 days after your surgery.  Your doctor's nurse will typically make your follow-up appointment when she calls you with your pathology report.  Expect your pathology report 2-3 business days after your surgery.  You may call to check if you do not hear from [place] after three days.   10. OTHER INSTRUCTIONS: ______________________________________________________________________________________________ ____________________________________________________________________________________________ WHEN TO CALL YOUR DOCTOR: 1. Fever over 101.0 2. Nausea and/or vomiting 3. Extreme swelling or bruising 4. Continued bleeding from incision. 5. Increased pain, redness, or drainage from the incision. The clinic staff is available to answer your questions during regular business hours.  Please don't hesitate to call and ask to speak to one of the nurses for clinical concerns.  If you have a medical emergency, go to the nearest emergency room or call 911.  A surgeon from [facility] is always on call at the hospital. [address] [place], [place], [place]  [address] ? [address], [place],  [place]   [address] [phone] ? [phone] ? FAX [phone] Web site: [email or web address]

## 2020-07-23 NOTE — Anesthesia Procedure Notes (Signed)
Procedure Name: LMA Insertion Date/Time: 07/23/2020 9:30 AM Performed by: Niel Hummer, CRNA Pre-anesthesia Checklist: Patient identified, Emergency Drugs available, Suction available and Patient being monitored Patient Re-evaluated:Patient Re-evaluated prior to induction Oxygen Delivery Method: Circle system utilized Preoxygenation: Pre-oxygenation with 100% oxygen Induction Type: IV induction LMA: LMA inserted LMA Size: 4.0 Number of attempts: 1 Dental Injury: Teeth and Oropharynx as per pre-operative assessment

## 2020-07-23 NOTE — Anesthesia Procedure Notes (Signed)
Anesthesia Regional Block: Pectoralis block   Pre-Anesthetic Checklist: ,, timeout performed, Correct Patient, Correct Site, Correct Laterality, Correct Procedure, Correct Position, site marked, Risks and benefits discussed, pre-op evaluation,  At surgeon's request and post-op pain management  Laterality: Left and Right  Prep: Maximum Sterile Barrier Precautions used, chloraprep       Needles:  Injection technique: Single-shot  Needle Type: Echogenic Stimulator Needle     Needle Length: 9cm  Needle Gauge: 21     Additional Needles:   Procedures:,,,, ultrasound used (permanent image in chart),,,,  Narrative:  Start time: 07/23/2020 8:30 AM End time: 07/23/2020 8:40 AM Injection made incrementally with aspirations every 5 mL. Anesthesiologist: Roderic Palau, MD  Additional Notes: 2% Lidocaine skin wheel. Bilateral PECS block.

## 2020-07-23 NOTE — Transfer of Care (Signed)
Immediate Anesthesia Transfer of Care Note  Patient: Haley [name]  Procedure(s) Performed: BILATERAL SIMPLE MASTECTOMIES WITH LEFT SENTINEL LYMPH NODE MAPPING (Bilateral Breast)  Patient Location: PACU  Anesthesia Type:General  Level of Consciousness: awake, alert  and oriented  Airway & Oxygen Therapy: Patient Spontanous Breathing and Patient connected to face mask oxygen  Post-op Assessment: Report given to RN, Post -op Vital signs reviewed and stable and Patient moving all extremities X 4  Post vital signs: Reviewed and stable  Last Vitals:  Vitals Value Taken Time  BP    Temp    Pulse 77 07/23/20 1218  Resp 62 07/23/20 1218  SpO2 100 % 07/23/20 1218  Vitals shown include unvalidated device data.  Last Pain:  Vitals:   07/23/20 0730  TempSrc: Oral  PainSc: 0-No pain      Patients Stated Pain Goal: 6 (80/04/47 1580)  Complications: No complications documented.

## 2020-07-23 NOTE — Interval H&P Note (Signed)
History and Physical Interval Note:  07/23/2020 8:51 AM  Haley [name]  has presented today for surgery, with the diagnosis of LEFT BREAST CANCER.  The various methods of treatment have been discussed with the patient and family. After consideration of risks, benefits and other options for treatment, the patient has consented to  Procedure(s) with comments: BILATERAL SIMPLE MASTECTOMIES WITH LEFT SENTINEL LYMPH NODE MAPPING (Bilateral) - RNFA as a surgical intervention.  The patient's history has been reviewed, patient examined, no change in status, stable for surgery.  I have reviewed the patient's chart and labs.  Questions were answered to the patient's satisfaction.     Turner Daniels MD

## 2020-07-24 ENCOUNTER — Encounter (HOSPITAL_BASED_OUTPATIENT_CLINIC_OR_DEPARTMENT_OTHER): Payer: Self-pay | Admitting: Surgery

## 2020-07-24 NOTE — Addendum Note (Signed)
Addendum  created 07/24/20 1216 by Tawni Millers, CRNA   Charge Capture section accepted

## 2020-07-24 NOTE — Discharge Summary (Signed)
Physician Discharge Summary  Patient ID: Haley [name] MRN: [id number] DOB/AGE: 09/23/1950 69 y.o.  Admit date: 07/23/2020 Discharge date: 07/24/2020  Admission Diagnoses:DCIS /LCIS   Discharge Diagnoses: SAME    Discharged Condition: good  Hospital Course: Pt did well s/p B simple mastectomy.  She had good pain control,  Tolerated her diet, ambulated and was hemodynamically stable.     Treatments: surgery: bilateral simple mastectomy    Discharge Exam: Blood pressure 118/60, pulse 71, temperature 97.9 F (36.6 C), resp. rate 18, height 5\' 8"  (1.727 m), weight 84.2 kg, SpO2 98 %. General appearance: alert and cooperative Resp: clear to auscultation bilaterally Cardio: NSR Incision/Wound:CDI flaps viable no hematoma JP drains serosanguinous   Disposition: Discharge disposition: 01-Home or Self Care       Discharge Instructions    Diet - low sodium heart healthy   Complete by: As directed    Increase activity slowly   Complete by: As directed         Signed: Joyice Faster Meah Jiron 07/24/2020, 9:16 AM

## 2020-07-28 ENCOUNTER — Encounter: Payer: Self-pay | Admitting: *Deleted

## 2020-07-29 LAB — SURGICAL PATHOLOGY

## 2020-08-07 ENCOUNTER — Other Ambulatory Visit: Payer: Self-pay

## 2020-08-07 ENCOUNTER — Inpatient Hospital Stay (HOSPITAL_COMMUNITY)
Admission: EM | Admit: 2020-08-07 | Discharge: 2020-08-11 | DRG: 920 | Disposition: A | Discharge status: Home / Self Care | Payer: Medicare Other | Attending: Surgery | Admitting: Surgery

## 2020-08-07 DIAGNOSIS — Z880 Allergy status to penicillin: Secondary | ICD-10-CM

## 2020-08-07 DIAGNOSIS — M96843 Postprocedural seroma of a musculoskeletal structure following other procedure: Principal | ICD-10-CM | POA: Diagnosis present

## 2020-08-07 DIAGNOSIS — Z888 Allergy status to other drugs, medicaments and biological substances status: Secondary | ICD-10-CM

## 2020-08-07 DIAGNOSIS — Z882 Allergy status to sulfonamides status: Secondary | ICD-10-CM

## 2020-08-07 DIAGNOSIS — Z881 Allergy status to other antibiotic agents status: Secondary | ICD-10-CM

## 2020-08-07 DIAGNOSIS — K219 Gastro-esophageal reflux disease without esophagitis: Secondary | ICD-10-CM | POA: Diagnosis not present

## 2020-08-07 DIAGNOSIS — L03313 Cellulitis of chest wall: Secondary | ICD-10-CM | POA: Diagnosis not present

## 2020-08-07 DIAGNOSIS — Z20822 Contact with and (suspected) exposure to covid-19: Secondary | ICD-10-CM | POA: Diagnosis not present

## 2020-08-07 DIAGNOSIS — N611 Abscess of the breast and nipple: Secondary | ICD-10-CM

## 2020-08-07 DIAGNOSIS — I10 Essential (primary) hypertension: Secondary | ICD-10-CM | POA: Diagnosis present

## 2020-08-07 DIAGNOSIS — E7800 Pure hypercholesterolemia, unspecified: Secondary | ICD-10-CM | POA: Diagnosis not present

## 2020-08-07 DIAGNOSIS — T8141XA Infection following a procedure, superficial incisional surgical site, initial encounter: Secondary | ICD-10-CM | POA: Diagnosis present

## 2020-08-07 DIAGNOSIS — Y838 Other surgical procedures as the cause of abnormal reaction of the patient, or of later complication, without mention of misadventure at the time of the procedure: Secondary | ICD-10-CM | POA: Diagnosis present

## 2020-08-07 DIAGNOSIS — Z87891 Personal history of nicotine dependence: Secondary | ICD-10-CM

## 2020-08-07 DIAGNOSIS — C50912 Malignant neoplasm of unspecified site of left female breast: Secondary | ICD-10-CM | POA: Diagnosis present

## 2020-08-07 DIAGNOSIS — Z79899 Other long term (current) drug therapy: Secondary | ICD-10-CM

## 2020-08-07 DIAGNOSIS — F419 Anxiety disorder, unspecified: Secondary | ICD-10-CM | POA: Diagnosis present

## 2020-08-07 DIAGNOSIS — Z85828 Personal history of other malignant neoplasm of skin: Secondary | ICD-10-CM

## 2020-08-07 DIAGNOSIS — L089 Local infection of the skin and subcutaneous tissue, unspecified: Secondary | ICD-10-CM | POA: Diagnosis present

## 2020-08-07 DIAGNOSIS — Z9013 Acquired absence of bilateral breasts and nipples: Secondary | ICD-10-CM

## 2020-08-07 LAB — URINALYSIS, ROUTINE W REFLEX MICROSCOPIC
Bilirubin Urine: NEGATIVE
Glucose, UA: NEGATIVE mg/dL
Hgb urine dipstick: NEGATIVE
Ketones, ur: NEGATIVE mg/dL
Leukocytes,Ua: NEGATIVE
Nitrite: NEGATIVE
Protein, ur: NEGATIVE mg/dL
Specific Gravity, Urine: 1.014 (ref 1.005–1.030)
pH: 5 (ref 5.0–8.0)

## 2020-08-07 LAB — CBC WITH DIFFERENTIAL/PLATELET
Abs Immature Granulocytes: 0.08 10*3/uL — ABNORMAL HIGH (ref 0.00–0.07)
Basophils Absolute: 0 10*3/uL (ref 0.0–0.1)
Basophils Relative: 0 %
Eosinophils Absolute: 0.3 10*3/uL (ref 0.0–0.5)
Eosinophils Relative: 2 %
HCT: 34.4 % — ABNORMAL LOW (ref 36.0–46.0)
Hemoglobin: 11.5 g/dL — ABNORMAL LOW (ref 12.0–15.0)
Immature Granulocytes: 1 %
Lymphocytes Relative: 5 %
Lymphs Abs: 0.6 10*3/uL — ABNORMAL LOW (ref 0.7–4.0)
MCH: 31.4 pg (ref 26.0–34.0)
MCHC: 33.4 g/dL (ref 30.0–36.0)
MCV: 94 fL (ref 80.0–100.0)
Monocytes Absolute: 1 10*3/uL (ref 0.1–1.0)
Monocytes Relative: 9 %
Neutro Abs: 10 10*3/uL — ABNORMAL HIGH (ref 1.7–7.7)
Neutrophils Relative %: 83 %
Platelets: 290 10*3/uL (ref 150–400)
RBC: 3.66 MIL/uL — ABNORMAL LOW (ref 3.87–5.11)
RDW: 13.2 % (ref 11.5–15.5)
WBC: 12 10*3/uL — ABNORMAL HIGH (ref 4.0–10.5)
nRBC: 0 % (ref 0.0–0.2)

## 2020-08-07 LAB — COMPREHENSIVE METABOLIC PANEL
ALT: 102 U/L — ABNORMAL HIGH (ref 0–44)
AST: 55 U/L — ABNORMAL HIGH (ref 15–41)
Albumin: 3 g/dL — ABNORMAL LOW (ref 3.5–5.0)
Alkaline Phosphatase: 179 U/L — ABNORMAL HIGH (ref 38–126)
Anion gap: 15 (ref 5–15)
BUN: 11 mg/dL (ref 8–23)
CO2: 17 mmol/L — ABNORMAL LOW (ref 22–32)
Calcium: 8.9 mg/dL (ref 8.9–10.3)
Chloride: 103 mmol/L (ref 98–111)
Creatinine, Ser: 0.66 mg/dL (ref 0.44–1.00)
GFR, Estimated: >60 mL/min (ref >60)
Glucose, Bld: 142 mg/dL — ABNORMAL HIGH (ref 70–99)
Potassium: 3.1 mmol/L — ABNORMAL LOW (ref 3.5–5.1)
Sodium: 135 mmol/L (ref 135–145)
Total Bilirubin: 0.4 mg/dL (ref 0.3–1.2)
Total Protein: 6.5 g/dL (ref 6.5–8.1)

## 2020-08-07 LAB — LACTIC ACID, PLASMA: Lactic Acid, Venous: 0.9 mmol/L (ref 0.5–1.9)

## 2020-08-07 NOTE — ED Triage Notes (Signed)
Pt here pov with co fever after a double mastectomy 2 weeks ago. Pt is having redness to the right breast and chest area. Was advised by her DR. To come in

## 2020-08-07 NOTE — ED Notes (Signed)
Pt stated that she took her own tylenol at 1650. Two "regular strength."

## 2020-08-07 NOTE — ED Notes (Signed)
Patient 1st lactic normal. 2nd to be d/c.

## 2020-08-07 NOTE — ED Notes (Signed)
Pt reported to ED tech that she is taking two of her own Tylenol

## 2020-08-08 ENCOUNTER — Other Ambulatory Visit: Payer: Self-pay

## 2020-08-08 ENCOUNTER — Observation Stay (HOSPITAL_COMMUNITY): Payer: Medicare Other

## 2020-08-08 DIAGNOSIS — K219 Gastro-esophageal reflux disease without esophagitis: Secondary | ICD-10-CM | POA: Diagnosis present

## 2020-08-08 DIAGNOSIS — C50912 Malignant neoplasm of unspecified site of left female breast: Secondary | ICD-10-CM | POA: Diagnosis present

## 2020-08-08 DIAGNOSIS — Y838 Other surgical procedures as the cause of abnormal reaction of the patient, or of later complication, without mention of misadventure at the time of the procedure: Secondary | ICD-10-CM | POA: Diagnosis present

## 2020-08-08 DIAGNOSIS — R222 Localized swelling, mass and lump, trunk: Secondary | ICD-10-CM | POA: Diagnosis not present

## 2020-08-08 DIAGNOSIS — T148XXA Other injury of unspecified body region, initial encounter: Secondary | ICD-10-CM | POA: Diagnosis not present

## 2020-08-08 DIAGNOSIS — Z872 Personal history of diseases of the skin and subcutaneous tissue: Secondary | ICD-10-CM | POA: Diagnosis not present

## 2020-08-08 DIAGNOSIS — Z85828 Personal history of other malignant neoplasm of skin: Secondary | ICD-10-CM | POA: Diagnosis not present

## 2020-08-08 DIAGNOSIS — F419 Anxiety disorder, unspecified: Secondary | ICD-10-CM | POA: Diagnosis present

## 2020-08-08 DIAGNOSIS — Z20822 Contact with and (suspected) exposure to covid-19: Secondary | ICD-10-CM | POA: Diagnosis present

## 2020-08-08 DIAGNOSIS — L03313 Cellulitis of chest wall: Secondary | ICD-10-CM | POA: Diagnosis present

## 2020-08-08 DIAGNOSIS — Z880 Allergy status to penicillin: Secondary | ICD-10-CM | POA: Diagnosis not present

## 2020-08-08 DIAGNOSIS — Z881 Allergy status to other antibiotic agents status: Secondary | ICD-10-CM | POA: Diagnosis not present

## 2020-08-08 DIAGNOSIS — Z87891 Personal history of nicotine dependence: Secondary | ICD-10-CM | POA: Diagnosis not present

## 2020-08-08 DIAGNOSIS — M96843 Postprocedural seroma of a musculoskeletal structure following other procedure: Secondary | ICD-10-CM | POA: Diagnosis present

## 2020-08-08 DIAGNOSIS — Z882 Allergy status to sulfonamides status: Secondary | ICD-10-CM | POA: Diagnosis not present

## 2020-08-08 DIAGNOSIS — T8140XA Infection following a procedure, unspecified, initial encounter: Secondary | ICD-10-CM | POA: Diagnosis not present

## 2020-08-08 DIAGNOSIS — E7800 Pure hypercholesterolemia, unspecified: Secondary | ICD-10-CM | POA: Diagnosis present

## 2020-08-08 DIAGNOSIS — L089 Local infection of the skin and subcutaneous tissue, unspecified: Secondary | ICD-10-CM | POA: Diagnosis present

## 2020-08-08 DIAGNOSIS — N611 Abscess of the breast and nipple: Secondary | ICD-10-CM | POA: Diagnosis not present

## 2020-08-08 DIAGNOSIS — C50911 Malignant neoplasm of unspecified site of right female breast: Secondary | ICD-10-CM | POA: Diagnosis not present

## 2020-08-08 DIAGNOSIS — I10 Essential (primary) hypertension: Secondary | ICD-10-CM | POA: Diagnosis present

## 2020-08-08 DIAGNOSIS — Z79899 Other long term (current) drug therapy: Secondary | ICD-10-CM | POA: Diagnosis not present

## 2020-08-08 DIAGNOSIS — T8141XA Infection following a procedure, superficial incisional surgical site, initial encounter: Secondary | ICD-10-CM | POA: Diagnosis present

## 2020-08-08 DIAGNOSIS — Z9013 Acquired absence of bilateral breasts and nipples: Secondary | ICD-10-CM | POA: Diagnosis not present

## 2020-08-08 DIAGNOSIS — T8142XA Infection following a procedure, deep incisional surgical site, initial encounter: Secondary | ICD-10-CM | POA: Diagnosis not present

## 2020-08-08 DIAGNOSIS — Z888 Allergy status to other drugs, medicaments and biological substances status: Secondary | ICD-10-CM | POA: Diagnosis not present

## 2020-08-08 LAB — CBC
HCT: 35.5 % — ABNORMAL LOW (ref 36.0–46.0)
Hemoglobin: 11.9 g/dL — ABNORMAL LOW (ref 12.0–15.0)
MCH: 31 pg (ref 26.0–34.0)
MCHC: 33.5 g/dL (ref 30.0–36.0)
MCV: 92.4 fL (ref 80.0–100.0)
Platelets: 234 10*3/uL (ref 150–400)
RBC: 3.84 MIL/uL — ABNORMAL LOW (ref 3.87–5.11)
RDW: 13.4 % (ref 11.5–15.5)
WBC: 8.8 10*3/uL (ref 4.0–10.5)
nRBC: 0 % (ref 0.0–0.2)

## 2020-08-08 LAB — BASIC METABOLIC PANEL
Anion gap: 12 (ref 5–15)
BUN: 9 mg/dL (ref 8–23)
CO2: 20 mmol/L — ABNORMAL LOW (ref 22–32)
Calcium: 8.5 mg/dL — ABNORMAL LOW (ref 8.9–10.3)
Chloride: 105 mmol/L (ref 98–111)
Creatinine, Ser: 0.62 mg/dL (ref 0.44–1.00)
GFR, Estimated: >60 mL/min (ref >60)
Glucose, Bld: 129 mg/dL — ABNORMAL HIGH (ref 70–99)
Potassium: 3.2 mmol/L — ABNORMAL LOW (ref 3.5–5.1)
Sodium: 137 mmol/L (ref 135–145)

## 2020-08-08 LAB — RESP PANEL BY RT-PCR (FLU A&B, COVID) ARPGX2
Influenza A by PCR: NEGATIVE
Influenza B by PCR: NEGATIVE
SARS Coronavirus 2 by RT PCR: NEGATIVE

## 2020-08-08 LAB — HIV ANTIBODY (ROUTINE TESTING W REFLEX): HIV Screen 4th Generation wRfx: NONREACTIVE

## 2020-08-08 MED ORDER — SIMVASTATIN 20 MG PO TABS
20.0000 mg | ORAL_TABLET | Freq: Every day | ORAL | Status: DC
  Administered 2020-08-08 – 2020-08-10 (×3): 20 mg via ORAL
  Filled 2020-08-08 (×3): qty 1

## 2020-08-08 MED ORDER — AMLODIPINE BESYLATE 5 MG PO TABS
5.0000 mg | ORAL_TABLET | Freq: Every day | ORAL | Status: DC
  Administered 2020-08-08 – 2020-08-11 (×4): 5 mg via ORAL
  Filled 2020-08-08 (×5): qty 1

## 2020-08-08 MED ORDER — IRBESARTAN 300 MG PO TABS
300.0000 mg | ORAL_TABLET | Freq: Every day | ORAL | Status: DC
  Administered 2020-08-08 – 2020-08-11 (×4): 300 mg via ORAL
  Filled 2020-08-08 (×6): qty 1

## 2020-08-08 MED ORDER — ENOXAPARIN SODIUM 40 MG/0.4ML ~~LOC~~ SOLN
40.0000 mg | SUBCUTANEOUS | Status: DC
  Administered 2020-08-08: 40 mg via SUBCUTANEOUS
  Filled 2020-08-08 (×3): qty 0.4

## 2020-08-08 MED ORDER — ALPRAZOLAM 0.5 MG PO TABS
0.5000 mg | ORAL_TABLET | Freq: Three times a day (TID) | ORAL | Status: DC | PRN
  Administered 2020-08-08 – 2020-08-10 (×4): 0.5 mg via ORAL
  Filled 2020-08-08: qty 2
  Filled 2020-08-08 (×3): qty 1

## 2020-08-08 MED ORDER — ONDANSETRON HCL 4 MG/2ML IJ SOLN: 4.0000 mg | Freq: Four times a day (QID) | INTRAMUSCULAR | Status: DC | PRN

## 2020-08-08 MED ORDER — SODIUM CHLORIDE 0.9 % IV SOLN: INTRAVENOUS | Status: DC

## 2020-08-08 MED ORDER — ZOLPIDEM TARTRATE 5 MG PO TABS: 5.0000 mg | ORAL_TABLET | Freq: Every evening | ORAL | Status: DC | PRN

## 2020-08-08 MED ORDER — SODIUM CHLORIDE 0.9 % IV SOLN
2.0000 g | Freq: Three times a day (TID) | INTRAVENOUS | Status: DC
  Administered 2020-08-08 – 2020-08-09 (×4): 2 g via INTRAVENOUS
  Filled 2020-08-08 (×4): qty 2

## 2020-08-08 MED ORDER — VANCOMYCIN HCL 750 MG/150ML IV SOLN
750.0000 mg | Freq: Two times a day (BID) | INTRAVENOUS | Status: DC
  Administered 2020-08-08 (×3): 750 mg via INTRAVENOUS
  Filled 2020-08-08 (×4): qty 150

## 2020-08-08 MED ORDER — IBUPROFEN 200 MG PO TABS
400.0000 mg | ORAL_TABLET | Freq: Three times a day (TID) | ORAL | Status: DC
  Administered 2020-08-08 – 2020-08-11 (×10): 400 mg via ORAL
  Filled 2020-08-08 (×11): qty 2

## 2020-08-08 MED ORDER — ONDANSETRON 4 MG PO TBDP: 4.0000 mg | ORAL_TABLET | Freq: Four times a day (QID) | ORAL | Status: DC | PRN

## 2020-08-08 MED ORDER — ACETAMINOPHEN 500 MG PO TABS
1000.0000 mg | ORAL_TABLET | Freq: Four times a day (QID) | ORAL | Status: DC
  Administered 2020-08-08 – 2020-08-11 (×12): 1000 mg via ORAL
  Filled 2020-08-08 (×12): qty 2

## 2020-08-08 NOTE — Progress Notes (Signed)
Pt was seen at bedside with no complaints. PA  At bedside and made aware of pt had a temp of 100.5, with no new orders at this time. Closely monitored. Denies any chest pain/discomfort at this time.

## 2020-08-08 NOTE — H&P (Signed)
Admitting Physician: Hyman Hopes Denee Boeder  Service: General surgery  CC: Redness around the right mastectomy incision  Subjective   HPI: Haley [name] is an 70 y.o. female who is here for redness around her right mastectomy incision. Has been having fevers. 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 Luisa Hart. She states her fever started on Monday and have been low-grade up until up last night when she had a higher grade fever. She takes a shower every other day and did not notice any redness 2 days ago, but today noticed a large amount of redness surrounding her right mastectomy incision and up into her axilla.  Past Medical History:  Diagnosis Date  . Anxiety   . Basal cell carcinoma    removed from right arm   . Bursitis    right   . GERD (gastroesophageal reflux disease)   . Hypercholesteremia   . Hypertension   . PONV (postoperative nausea and vomiting)     Past Surgical History:  Procedure Laterality Date  . BREAST EXCISIONAL BIOPSY Right 1970   benign  . BREAST EXCISIONAL BIOPSY Left 2009   benign LCIS  . BREAST LUMPECTOMY WITH RADIOACTIVE SEED LOCALIZATION Left 04/18/2019   Procedure: LEFT BREAST LUMPECTOMY WITH RADIOACTIVE SEED LOCALIZATION;  Surgeon: Harriette Bouillon, MD;  Location: [facility];  Service: General;  Laterality: Left;  . breast tumor   1970  . INCISIONAL BREAST BIOPSY Left   . MASTECTOMY W/ SENTINEL NODE BIOPSY Bilateral 07/23/2020   Procedure: BILATERAL SIMPLE MASTECTOMIES WITH LEFT SENTINEL LYMPH NODE MAPPING;  Surgeon: Harriette Bouillon, MD;  Location: [facility];  Service: General;  Laterality: Bilateral;  . OPEN SURGICAL REPAIR OF GLUTEAL TENDON Right 05/23/2018   Procedure: right hip bursectomy; gluteal tendon repair;  Surgeon: Ollen Gross, MD;  Location: [facility];  Service: Orthopedics;  Laterality: Right;    . rotator   05/2007   rigth rotator cuff repair     No family history on file.  Social:  reports that she quit smoking about 11 years ago. She has quit using smokeless tobacco.  Her smokeless tobacco use included chew. She reports current alcohol use. She reports that she does not use drugs.  Allergies:  Allergies  Allergen Reactions  . Cephalexin Diarrhea  . Potassium Chloride Other (See Comments)    Stomach irritation  . Sulfamethoxazole-Trimethoprim Other (See Comments)    Stomach irritated  . Tape Itching  . Clindamycin Hives  . Penicillin G Rash and Other (See Comments)    Has patient had a PCN reaction causing immediate rash, facial/tongue/throat swelling, SOB or lightheadedness with hypotension: Yes Has patient had a PCN reaction causing severe rash involving mucus membranes or skin necrosis: No Has patient had a PCN reaction that required hospitalization: No Has patient had a PCN reaction occurring within the last 10 years: No If all of the above answers are "NO", then may proceed with Cephalosporin use.     Medications: Current Outpatient Medications  Medication Instructions  . ALPRAZolam (XANAX) 0.25 mg, Oral, Daily PRN  . amLODipine (NORVASC) 5 mg, Oral, Daily  . eszopiclone (LUNESTA) 2 mg, Oral, Daily at bedtime  . ibuprofen (ADVIL) 800 mg, Oral, Every 8 hours PRN  . ibuprofen (ADVIL) 800 mg, Oral, Every 8 hours PRN  . loratadine (CLARITIN) 10 mg, Oral, Daily  . oxyCODONE (OXY IR/ROXICODONE) 5 mg, Oral, Every 6  hours PRN  . simvastatin (ZOCOR) 20 mg, Oral, Daily at bedtime  . triamcinolone cream (KENALOG) 0.1 % 1 application, Topical, 2 times daily  . valsartan (DIOVAN) 320 mg, Oral, Daily  . Vitamin D, Ergocalciferol, (DRISDOL) 50000 units CAPS capsule 50,000 capsules, Oral, Every Sun    ROS - all of the below systems have been reviewed with the patient and positives are indicated with bold text General: chills, fever or night sweats Eyes: blurry  vision or double vision ENT: epistaxis or sore throat Allergy/Immunology: itchy/watery eyes or nasal congestion Hematologic/Lymphatic: bleeding problems, blood clots or swollen lymph nodes Endocrine: temperature intolerance or unexpected weight changes Breast: new or changing breast lumps or nipple discharge Resp: cough, shortness of breath, or wheezing CV: chest pain or dyspnea on exertion GI: as per HPI GU: dysuria, trouble voiding, or hematuria MSK: joint pain or joint stiffness Neuro: TIA or stroke symptoms Derm: pruritus and skin lesion changes Psych: anxiety and depression  Objective   PE Blood pressure 132/75, pulse 99, temperature (!) 100.7 F (38.2 C), temperature source Oral, resp. rate 16, height 5\' 8"  (1.727 m), weight 79.4 kg, SpO2 100 %. Constitutional: NAD; conversant; no deformities Eyes: Moist conjunctiva; no lid lag; anicteric; PERRL Neck: Trachea midline; no thyromegaly Lungs: Normal respiratory effort; no tactile fremitus Breasts:  Left mastectomy incision healing well. Minor amount of redness at the lateral aspect of the incision toward the axilla. No significant induration on the left. JP drain still present on the left with serous drainage and some blood clots. Left mastectomy incision with erythema and induration spreading to the limits of the operative field. Significant induration in the axilla. No palpable fluid collections. CV: RRR; no palpable thrills; no pitting edema GI: Abd soft, nontender, nondistended; no palpable hepatosplenomegaly MSK: Normal range of motion of extremities; no clubbing/cyanosis Psychiatric: Appropriate affect; alert and oriented x3 Lymphatic: No palpable cervical or axillary lymphadenopathy  Results for orders placed or performed during the hospital encounter of 08/07/20 (from the past 24 hour(s))  Comprehensive metabolic panel     Status: Abnormal   Collection Time: 08/07/20 12:36 PM  Result Value Ref Range   Sodium 135 135 -  145 mmol/L   Potassium 3.1 (L) 3.5 - 5.1 mmol/L   Chloride 103 98 - 111 mmol/L   CO2 17 (L) 22 - 32 mmol/L   Glucose, Bld 142 (H) 70 - 99 mg/dL   BUN 11 8 - 23 mg/dL   Creatinine, Ser 0.66 0.44 - 1.00 mg/dL   Calcium 8.9 8.9 - 10.3 mg/dL   Total Protein 6.5 6.5 - 8.1 g/dL   Albumin 3.0 (L) 3.5 - 5.0 g/dL   AST 55 (H) 15 - 41 U/L   ALT 102 (H) 0 - 44 U/L   Alkaline Phosphatase 179 (H) 38 - 126 U/L   Total Bilirubin 0.4 0.3 - 1.2 mg/dL   GFR, Estimated >60 >60 mL/min   Anion gap 15 5 - 15  CBC with Differential     Status: Abnormal   Collection Time: 08/07/20 12:36 PM  Result Value Ref Range   WBC 12.0 (H) 4.0 - 10.5 K/uL   RBC 3.66 (L) 3.87 - 5.11 MIL/uL   Hemoglobin 11.5 (L) 12.0 - 15.0 g/dL   HCT 34.4 (L) 36.0 - 46.0 %   MCV 94.0 80.0 - 100.0 fL   MCH 31.4 26.0 - 34.0 pg   MCHC 33.4 30.0 - 36.0 g/dL   RDW 13.2 11.5 - 15.5 %   Platelets  290 150 - 400 K/uL   nRBC 0.0 0.0 - 0.2 %   Neutrophils Relative % 83 %   Neutro Abs 10.0 (H) 1.7 - 7.7 K/uL   Lymphocytes Relative 5 %   Lymphs Abs 0.6 (L) 0.7 - 4.0 K/uL   Monocytes Relative 9 %   Monocytes Absolute 1.0 0.1 - 1.0 K/uL   Eosinophils Relative 2 %   Eosinophils Absolute 0.3 0.0 - 0.5 K/uL   Basophils Relative 0 %   Basophils Absolute 0.0 0.0 - 0.1 K/uL   Immature Granulocytes 1 %   Abs Immature Granulocytes 0.08 (H) 0.00 - 0.07 K/uL  Lactic acid, plasma     Status: None   Collection Time: 08/07/20  1:18 PM  Result Value Ref Range   Lactic Acid, Venous 0.9 0.5 - 1.9 mmol/L  Urinalysis, Routine w reflex microscopic Urine, Clean Catch     Status: None   Collection Time: 08/07/20  1:22 PM  Result Value Ref Range   Color, Urine YELLOW YELLOW   APPearance CLEAR CLEAR   Specific Gravity, Urine 1.014 1.005 - 1.030   pH 5.0 5.0 - 8.0   Glucose, UA NEGATIVE NEGATIVE mg/dL   Hgb urine dipstick NEGATIVE NEGATIVE   Bilirubin Urine NEGATIVE NEGATIVE   Ketones, ur NEGATIVE NEGATIVE mg/dL   Protein, ur NEGATIVE NEGATIVE mg/dL    Nitrite NEGATIVE NEGATIVE   Leukocytes,Ua NEGATIVE NEGATIVE    Imaging Orders  No imaging studies ordered today     Assessment and Plan   Evelynn Madera Grayson is an 70 y.o. female who is here for redness around her right mastectomy incision, likely due to wound infection with cellulitis, possibly a underlying abscess.  Right mastectomy incision infection -Cefepime and vancomycin, appreciate pharmacy assistance, multiple allergies -Check a right chest ultrasound to evaluate for underlying fluid collections -Regular diet -Home medications    Louanna Raw, M.D. General, Bariatric and Minimally Invasive Surgery  [facility] Use [email or web address] to contact on call provider

## 2020-08-08 NOTE — Progress Notes (Signed)
Pharmacy Antibiotic Note  Haley [name] is a 70 y.o. female admitted on 08/07/2020 with cellulitis.  Pharmacy has been consulted for Vancomycin/Cefepime dosing. WBC mildly elevated. Renal function good. Recent double mastectomy.   Plan: Vancomycin 750 mg IV q12h Cefepime 2g IV q8h Trend WBC, temp, renal function  F/U infectious work-up Drug levels as indicated   Height: 5\' 8"  (172.7 cm) Weight: 79.4 kg (175 lb) IBW/kg (Calculated) : 63.9  Temp (24hrs), Avg:100.2 F (37.9 C), Min:99.3 F (37.4 C), Max:100.7 F (38.2 C)  Recent Labs  Lab 08/07/20 1236 08/07/20 1318  WBC 12.0*  --   CREATININE 0.66  --   LATICACIDVEN  --  0.9    Estimated Creatinine Clearance: 73.4 mL/min (by C-G formula based on SCr of 0.66 mg/dL).    Allergies  Allergen Reactions  . Cephalexin Diarrhea  . Potassium Chloride Other (See Comments)    Stomach irritation  . Sulfamethoxazole-Trimethoprim Other (See Comments)    Stomach irritated  . Tape Itching  . Clindamycin Hives  . Penicillin G Rash and Other (See Comments)    Has patient had a PCN reaction causing immediate rash, facial/tongue/throat swelling, SOB or lightheadedness with hypotension: Yes Has patient had a PCN reaction causing severe rash involving mucus membranes or skin necrosis: No Has patient had a PCN reaction that required hospitalization: No Has patient had a PCN reaction occurring within the last 10 years: No If all of the above answers are "NO", then may proceed with Cephalosporin use.     08/09/20, PharmD, BCPS Clinical Pharmacist Phone: [phone]

## 2020-08-08 NOTE — Plan of Care (Signed)

## 2020-08-08 NOTE — Progress Notes (Signed)
This Clinical research associate has been informed that pt has a temp of 100.5. Per charge nurse she will notified attending MD.

## 2020-08-08 NOTE — ED Notes (Signed)
Pt took tylenol and xanax

## 2020-08-08 NOTE — Progress Notes (Signed)
   08/08/20 0800  Vitals  Temp (!) 100.5 F (38.1 C)  Temp Source Oral  BP (!) 149/67  MAP (mmHg) 90  BP Location Right Leg  BP Method Automatic  Patient Position (if appropriate) Lying  Pulse Rate (!) 101  Pulse Rate Source Dinamap  Resp 18  Level of Consciousness  Level of Consciousness Alert  MEWS COLOR  MEWS Score Color Yellow  Oxygen Therapy  SpO2 100 %  O2 Device Room Air  Patient Activity (if Appropriate) In bed  Pain Assessment  Pain Scale 0-10  Pain Score 0  PCA/Epidural/Spinal Assessment  Respiratory Pattern Regular;Unlabored;Symmetrical  Glasgow Coma Scale  Eye Opening 4  Best Verbal Response (NON-intubated) 5  Best Motor Response 6  Glasgow Coma Scale Score 15  MEWS Score  MEWS Temp 1  MEWS Systolic 0  MEWS Pulse 1  MEWS RR 0  MEWS LOC 0  MEWS Score 2  Provider Notification  Provider Name/Title Barnetta Chapel, PA  Date Provider Notified 08/08/20  Time Provider Notified [phone]  Notification Type Page  Notification Reason Other (Comment) (elevated temp and HR)  Response No new orders  Rapid Response Notification  Name of Rapid Response RN Notified n/a   Pt is alert and oriented x4, not in distress, primary RN notified and Kelly-PA paged. Seen by rounding MD. Scheduled tylenol given, VS taken at 09:00, HR and temp improved.

## 2020-08-08 NOTE — Progress Notes (Signed)
   08/08/20 0900  Document  Progress note created (see row info) Yes

## 2020-08-08 NOTE — ED Provider Notes (Signed)
[facility] EMERGENCY DEPARTMENT Provider Note   CSN: [id number] Arrival date & time: 08/07/20  1142     History Chief Complaint  Patient presents with  . post op fever    Haley [name] is a 70 y.o. female.  Patient presents to the emergency department for evaluation of fever.  Patient reports that she has been running a fever for 5 days.  Patient had double mastectomy 2 weeks ago.  She saw her surgeon earlier this week and had 3 of 4 drains pulled.  The remaining drain is putting out minimal fluid.  Today she has noticed redness of the right side of her chest wall.  No drainage.        Past Medical History:  Diagnosis Date  . Anxiety   . Basal cell carcinoma    removed from right arm   . Bursitis    right   . GERD (gastroesophageal reflux disease)   . Hypercholesteremia   . Hypertension   . PONV (postoperative nausea and vomiting)     Patient Active Problem List   Diagnosis Date Noted  . Greater trochanteric bursitis of right hip 05/23/2018  . Trochanteric bursitis, right hip 05/23/2018    Past Surgical History:  Procedure Laterality Date  . BREAST EXCISIONAL BIOPSY Right 1970   benign  . BREAST EXCISIONAL BIOPSY Left 2009   benign LCIS  . BREAST LUMPECTOMY WITH RADIOACTIVE SEED LOCALIZATION Left 04/18/2019   Procedure: LEFT BREAST LUMPECTOMY WITH RADIOACTIVE SEED LOCALIZATION;  Surgeon: Erroll Luna, MD;  Location: [place] [facility];  Service: General;  Laterality: Left;  . breast tumor   1970  . INCISIONAL BREAST BIOPSY Left   . MASTECTOMY W/ SENTINEL NODE BIOPSY Bilateral 07/23/2020   Procedure: BILATERAL SIMPLE MASTECTOMIES WITH LEFT SENTINEL LYMPH NODE MAPPING;  Surgeon: Erroll Luna, MD;  Location: [place];  Service: General;  Laterality: Bilateral;  . OPEN SURGICAL REPAIR OF GLUTEAL TENDON Right 05/23/2018   Procedure: right hip bursectomy; gluteal tendon repair;  Surgeon: Gaynelle Arabian, MD;   Location: [facility];  Service: Orthopedics;  Laterality: Right;  60min  . rotator   05/2007   rigth rotator cuff repair      OB History   No obstetric history on file.     No family history on file.  Social History   Tobacco Use  . Smoking status: Former Smoker    Quit date: 05/17/2009    Years since quitting: 11.2  . Smokeless tobacco: Former Systems developer    Types: Secondary school teacher  . Vaping Use: Never used  Substance Use Topics  . Alcohol use: Yes    Comment: occ  . Drug use: Never    Home Medications Prior to Admission medications   Medication Sig Start Date End Date Taking? Authorizing Provider  ALPRAZolam Duanne Moron) 0.25 MG tablet Take 0.25 mg by mouth daily as needed for anxiety.  09/01/16   [provider]  amLODipine (NORVASC) 5 MG tablet Take 5 mg by mouth daily.  08/22/16   [provider]  eszopiclone (LUNESTA) 2 MG TABS tablet Take 2 mg by mouth at bedtime.  06/13/16   [provider]  ibuprofen (ADVIL) 800 MG tablet Take 1 tablet (800 mg total) by mouth every 8 (eight) hours as needed. 04/18/19   Cornett, Marcello Moores, MD  ibuprofen (ADVIL) 800 MG tablet Take 1 tablet (800 mg total) by mouth every 8 (eight) hours as needed. 07/23/20   Erroll Luna, MD  loratadine (CLARITIN) 10 MG tablet Take 10 mg by mouth daily.    [provider]  oxyCODONE (OXY IR/ROXICODONE) 5 MG immediate release tablet Take 1 tablet (5 mg total) by mouth every 6 (six) hours as needed for severe pain. 07/23/20   Cornett, Maisie Fus, MD  simvastatin (ZOCOR) 20 MG tablet Take 20 mg by mouth at bedtime.  09/01/16   [provider]  triamcinolone cream (KENALOG) 0.1 % Apply 1 application topically 2 (two) times daily.    [provider]  valsartan (DIOVAN) 320 MG tablet Take 320 mg by mouth daily. 08/22/16   [provider]  Vitamin D, Ergocalciferol, (DRISDOL) 50000 units CAPS capsule Take 50,000 capsules by mouth every Sunday.  08/22/16   [provider]    Allergies    Cephalexin, Potassium chloride, Sulfamethoxazole-trimethoprim, Tape, Clindamycin, and Penicillin g  Review of Systems   Review of Systems  Constitutional: Positive for fever.  Skin: Positive for color change.  All other systems reviewed and are negative.   Physical Exam Updated Vital Signs BP 132/75   Pulse 99   Temp (!) 100.7 F (38.2 C) (Oral)   Resp 16   Ht 5\' 8"  (1.727 m)   Wt 79.4 kg   SpO2 100%   BMI 26.61 kg/m   Physical Exam Vitals and nursing note reviewed.  Constitutional:      General: She is not in acute distress.    Appearance: Normal appearance. She is well-developed and well-nourished.  HENT:     Head: Normocephalic and atraumatic.     Right Ear: Hearing normal.     Left Ear: Hearing normal.     Nose: Nose normal.     Mouth/Throat:     Mouth: Oropharynx is clear and moist and mucous membranes are normal.  Eyes:     Extraocular Movements: EOM normal.     Conjunctiva/sclera: Conjunctivae normal.     Pupils: Pupils are equal, round, and reactive to light.  Cardiovascular:     Rate and Rhythm: Regular rhythm.     Heart sounds: S1 normal and S2 normal. No murmur heard. No friction rub. No gallop.   Pulmonary:     Effort: Pulmonary effort is normal. No respiratory distress.     Breath sounds: Normal breath sounds.  Chest:     Chest wall: No tenderness.  Abdominal:     General: Bowel sounds are normal.     Palpations: Abdomen is soft. There is no hepatosplenomegaly.     Tenderness: There is no abdominal tenderness. There is no guarding or rebound. Negative signs include Murphy's sign and McBurney's sign.     Hernia: No hernia is present.  Musculoskeletal:        General: Normal range of motion.     Cervical back: Normal range of motion and neck supple.  Skin:    General: Skin is warm, dry and intact.     Findings: Erythema (Tracking from surgical incision on right chest wall superiorly and also laterally around the  flank) present. No rash.     Nails: There is no cyanosis.  Neurological:     Mental Status: She is alert and oriented to person, place, and time.     GCS: GCS eye subscore is 4. GCS verbal subscore is 5. GCS motor subscore is 6.     Cranial Nerves: No cranial nerve deficit.     Sensory: No sensory deficit.     Coordination: Coordination normal.     Deep  Tendon Reflexes: Strength normal.  Psychiatric:        Mood and Affect: Mood and affect normal.        Speech: Speech normal.        Behavior: Behavior normal.        Thought Content: Thought content normal.     ED Results / Procedures / Treatments   Labs (all labs ordered are listed, but only abnormal results are displayed) Labs Reviewed  COMPREHENSIVE METABOLIC PANEL - Abnormal; Notable for the following components:      Result Value   Potassium 3.1 (*)    CO2 17 (*)    Glucose, Bld 142 (*)    Albumin 3.0 (*)    AST 55 (*)    ALT 102 (*)    Alkaline Phosphatase 179 (*)    All other components within normal limits  CBC WITH DIFFERENTIAL/PLATELET - Abnormal; Notable for the following components:   WBC 12.0 (*)    RBC 3.66 (*)    Hemoglobin 11.5 (*)    HCT 34.4 (*)    Neutro Abs 10.0 (*)    Lymphs Abs 0.6 (*)    Abs Immature Granulocytes 0.08 (*)    All other components within normal limits  CULTURE, BLOOD (ROUTINE X 2)  CULTURE, BLOOD (ROUTINE X 2)  URINALYSIS, ROUTINE W REFLEX MICROSCOPIC  LACTIC ACID, PLASMA    EKG None  Radiology No results found.  Procedures Procedures (including critical care time)  Medications Ordered in ED Medications - No data to display  ED Course  I have reviewed the triage vital signs and the nursing notes.  Pertinent labs & imaging results that were available during my care of the patient were reviewed by me and considered in my medical decision making (see chart for details).    MDM Rules/Calculators/A&P                          Patient presents to the emergency  department for evaluation of fever and cellulitic changes of the right chest wall, 2 weeks status post bilateral mastectomy.  Patient has a very mild leukocytosis but normal lactic acid.  Vital signs unremarkable other than mild tachycardia coinciding with the fever.  She does not appear septic.  Will consult general surgery to evaluate the patient for treatment plan and disposition.  Final Clinical Impression(s) / ED Diagnoses Final diagnoses:  Cellulitis of chest wall    Rx / DC Orders ED Discharge Orders    None       Alianah Lofton, Gwenyth Allegra, MD 08/08/20 [phone]

## 2020-08-08 NOTE — Progress Notes (Signed)
    CC: Right mastectomy infection  Subjective: Patient is doing well this AM.  Her husband says the redness is somewhat better.  She still very erythematous along the whole right mastectomy site.  She is still running a low-grade fever.  She has 1 drain remaining on the left side which is a cloudy serosanguineous fluid.  Her husband has been draining this.  Objective: Vital signs in last 24 hours: Temp:  [99.3 F (37.4 C)-100.7 F (38.2 C)] 100.5 F (38.1 C) (01/01 0800) Pulse Rate:  [93-114] 101 (01/01 0800) Resp:  [16-20] 18 (01/01 0800) BP: (132-151)/(66-82) 149/67 (01/01 0800) SpO2:  [95 %-100 %] 100 % (01/01 0800) Weight:  [79.4 kg] 79.4 kg (12/31 1204)  IV Vanco and Maxipime only IVs listed Persistent low-grade fever 100.7>>100.5 Blood pressure stable A.m. labs are stable, potassium 3.2, WBC 12.0>> 8.8 Respiratory panel negative Urinalysis is negative. Blood cultures pending Ultrasound of the chest is pending.   Intake/Output from previous day: 12/31 0701 - 01/01 0700 In: 255 [IV Piggyback:255] Out: -  Intake/Output this shift: No intake/output data recorded.  General appearance: alert, cooperative and no distress Resp: clear to auscultation bilaterally Chest wall: Right chest wall around the mastectomy site is erythematous tender and warm.  Left breast mastectomy site looks fine.  One drain remains with a cloudy serosanguineous fluid in the bulb.  I did not feel any fluid collections on exam.  Lab Results:  Recent Labs    08/07/20 1236 08/08/20 0426  WBC 12.0* 8.8  HGB 11.5* 11.9*  HCT 34.4* 35.5*  PLT 290 234    BMET Recent Labs    08/07/20 1236 08/08/20 0426  NA 135 137  K 3.1* 3.2*  CL 103 105  CO2 17* 20*  GLUCOSE 142* 129*  BUN 11 9  CREATININE 0.66 0.62  CALCIUM 8.9 8.5*   PT/INR No results for input(s): LABPROT, INR in the last 72 hours.  Recent Labs  Lab 08/07/20 1236  AST 55*  ALT 102*  ALKPHOS 179*  BILITOT 0.4  PROT 6.5   ALBUMIN 3.0*     Lipase  No results found for: LIPASE   Medications: . acetaminophen  1,000 mg Oral Q6H  . amLODipine  5 mg Oral Daily  . enoxaparin (LOVENOX) injection  40 mg Subcutaneous Q24H  . ibuprofen  400 mg Oral TID  . irbesartan  300 mg Oral Daily  . simvastatin  20 mg Oral q1800   . sodium chloride 50 mL/hr at 08/08/20 0821  . ceFEPime (MAXIPIME) IV Stopped (08/08/20 0300)  . vancomycin Stopped (08/08/20 3329)    Assessment/Plan Hx anxiety disorder Hx hypertension Hypercholesterolemia    Left breast cancer with right mastectomy incision infection S/P left mastectomy with sentinel node biopsy and right prophylactic mastectomy 07/23/2020, Dr. Maisie Fus Cornett  FEN: Regular diet ID: Maxipime/vancomycin 1/1>> day 1 DVT: Lovenox Follow-up: Dr. Luisa Hart  Plan: Continue IV antibiotics.  Ultrasound is pending.  LOS: 0 days    Dalyla Chui 08/08/2020 Please see Amion

## 2020-08-09 ENCOUNTER — Inpatient Hospital Stay (HOSPITAL_COMMUNITY): Payer: Medicare Other | Admitting: Certified Registered"

## 2020-08-09 ENCOUNTER — Encounter (HOSPITAL_COMMUNITY): Admission: EM | Disposition: A | Discharge status: Home / Self Care | Payer: Self-pay | Source: Home / Self Care

## 2020-08-09 ENCOUNTER — Inpatient Hospital Stay (HOSPITAL_COMMUNITY): Payer: Medicare Other

## 2020-08-09 DIAGNOSIS — T148XXA Other injury of unspecified body region, initial encounter: Secondary | ICD-10-CM | POA: Diagnosis not present

## 2020-08-09 DIAGNOSIS — L089 Local infection of the skin and subcutaneous tissue, unspecified: Secondary | ICD-10-CM

## 2020-08-09 DIAGNOSIS — C50911 Malignant neoplasm of unspecified site of right female breast: Secondary | ICD-10-CM | POA: Diagnosis not present

## 2020-08-09 DIAGNOSIS — C50912 Malignant neoplasm of unspecified site of left female breast: Secondary | ICD-10-CM | POA: Diagnosis not present

## 2020-08-09 SURGERY — IRRIGATION AND DEBRIDEMENT ABSCESS
Anesthesia: General | Laterality: Right

## 2020-08-09 MED ORDER — LIDOCAINE HCL (PF) 1 % IJ SOLN
INTRAMUSCULAR | Status: AC
  Filled 2020-08-09: qty 30

## 2020-08-09 MED ORDER — WHITE PETROLATUM EX OINT
TOPICAL_OINTMENT | CUTANEOUS | Status: DC | PRN
  Administered 2020-08-09: 0.2 via TOPICAL
  Filled 2020-08-09: qty 28.35

## 2020-08-09 MED ORDER — TRAMADOL HCL 50 MG PO TABS
50.0000 mg | ORAL_TABLET | Freq: Four times a day (QID) | ORAL | Status: DC | PRN
Start: 2020-08-09 — End: 2020-08-11

## 2020-08-09 MED ORDER — POTASSIUM CHLORIDE IN NACL 20-0.9 MEQ/L-% IV SOLN
INTRAVENOUS | Status: DC
  Filled 2020-08-09: qty 1000

## 2020-08-09 MED ORDER — SODIUM CHLORIDE 0.9 % IV SOLN
1.0000 g | Freq: Three times a day (TID) | INTRAVENOUS | Status: DC
  Administered 2020-08-09 – 2020-08-10 (×3): 1 g via INTRAVENOUS
  Filled 2020-08-09 (×5): qty 1

## 2020-08-09 MED ORDER — VANCOMYCIN HCL 750 MG/150ML IV SOLN
750.0000 mg | Freq: Two times a day (BID) | INTRAVENOUS | Status: DC
  Administered 2020-08-09 – 2020-08-11 (×4): 750 mg via INTRAVENOUS
  Filled 2020-08-09 (×6): qty 150

## 2020-08-09 MED ORDER — SODIUM CHLORIDE 0.9% FLUSH
5.0000 mL | Freq: Three times a day (TID) | INTRAVENOUS | Status: DC
  Administered 2020-08-09 – 2020-08-11 (×7): 5 mL

## 2020-08-09 NOTE — Plan of Care (Signed)

## 2020-08-09 NOTE — Consult Note (Signed)
[facility]    Date of Admission:  08/07/2020   Total days of antibiotics: 2 vanco/merrem               Reason for Consult: wound infection    Referring Provider: Daphine Deutscher   Assessment: R mastectomy wound infection Breast Cancer, bilateral. LN negative.  PEN allergy  Plan: 1. Await Cultures, gram stains.  2. Consider clinda however she has prev allergy.  3. Agree with adding merrem due to worsening.  4. Will narrow as Cx data available 5. Back to OR today, more Cx please.  6. Pt asks for lip balm, vasoline ordered...   Thank you so much for this interesting consult,  Active Problems:   Wound infection   . acetaminophen  1,000 mg Oral Q6H  . amLODipine  5 mg Oral Daily  . enoxaparin (LOVENOX) injection  40 mg Subcutaneous Q24H  . ibuprofen  400 mg Oral TID  . irbesartan  300 mg Oral Daily  . simvastatin  20 mg Oral q1800    HPI: Haley [name] is a 70 y.o. 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  She was adm to hospital 12-31 with temp 100.7 and WBC 12.0. She was noted to have PEn allergy (childhood) and started on vanco/cefepime.  Last pm she had soaking sweats and temp 100.5. She was noted to have more erythema, blister, and a fluid wave.  She had fluid aspirated and she has been scheduled for surgery. Her vanco was continued and her cefepime was changed to merrem (starting at 2pm, after surgery).   Review of Systems: Review of Systems  Constitutional: Positive for chills and fever.  Respiratory: Negative for cough and shortness of breath.   Gastrointestinal: Positive for constipation. Negative for diarrhea.  Genitourinary: Negative for dysuria.  All other systems reviewed and are negative.   Past Medical History:  Diagnosis Date  . Anxiety   . Basal cell  carcinoma    removed from right arm   . Bursitis    right   . GERD (gastroesophageal reflux disease)   . Hypercholesteremia   . Hypertension   . PONV (postoperative nausea and vomiting)     Social History   Tobacco Use  . Smoking status: Former Smoker    Quit date: 05/17/2009    Years since quitting: 11.2  . Smokeless tobacco: Former Neurosurgeon    Types: Engineer, drilling  . Vaping Use: Never used  Substance Use Topics  . Alcohol use: Yes    Comment: occ  . Drug use: Never    No family history on file.   Medications:  Scheduled: . acetaminophen  1,000 mg Oral Q6H  . amLODipine  5 mg Oral Daily  . enoxaparin (LOVENOX) injection  40 mg Subcutaneous Q24H  . ibuprofen  400 mg Oral TID  . irbesartan  300 mg Oral Daily  . simvastatin  20 mg Oral q1800    Abtx:  Anti-infectives (From admission, onward)   Start     Dose/Rate Route Frequency Ordered Stop   08/09/20 1400  meropenem (MERREM) 1 g in sodium chloride 0.9 % 100 mL IVPB        1 g 200 mL/hr over 30 Minutes Intravenous Every 8 hours 08/09/20 0944  08/08/20 0200  vancomycin (VANCOREADY) IVPB 750 mg/150 mL  Status:  Discontinued        750 mg 150 mL/hr over 60 Minutes Intravenous Every 12 hours 08/08/20 0151 08/09/20 0942   08/08/20 0200  ceFEPIme (MAXIPIME) 2 g in sodium chloride 0.9 % 100 mL IVPB  Status:  Discontinued        2 g 200 mL/hr over 30 Minutes Intravenous Every 8 hours 08/08/20 0151 08/09/20 0942        OBJECTIVE: Blood pressure 132/62, pulse 90, temperature 99.6 F (37.6 C), temperature source Oral, resp. rate 20, height 5\' 8"  (1.727 m), weight 79.4 kg, SpO2 96 %.  Physical Exam Vitals reviewed.  Constitutional:      General: She is not in acute distress.    Appearance: Normal appearance. She is not ill-appearing, toxic-appearing or diaphoretic.  HENT:     Mouth/Throat:     Mouth: Mucous membranes are moist.     Pharynx: No oropharyngeal exudate.  Eyes:     Extraocular Movements:  Extraocular movements intact.     Pupils: Pupils are equal, round, and reactive to light.  Cardiovascular:     Rate and Rhythm: Normal rate and regular rhythm.  Pulmonary:     Effort: Pulmonary effort is normal.     Breath sounds: Normal breath sounds.  Chest:    Abdominal:     General: Bowel sounds are normal. There is no distension.     Palpations: Abdomen is soft.     Tenderness: There is no abdominal tenderness. There is no guarding.  Musculoskeletal:     Cervical back: Normal range of motion and neck supple.     Right lower leg: No edema.     Left lower leg: No edema.  Neurological:     General: No focal deficit present.     Mental Status: She is alert.  Psychiatric:        Mood and Affect: Mood normal.     Lab Results Results for orders placed or performed during the hospital encounter of 08/07/20 (from the past 48 hour(s))  Comprehensive metabolic panel     Status: Abnormal   Collection Time: 08/07/20 12:36 PM  Result Value Ref Range   Sodium 135 135 - 145 mmol/L   Potassium 3.1 (L) 3.5 - 5.1 mmol/L   Chloride 103 98 - 111 mmol/L   CO2 17 (L) 22 - 32 mmol/L   Glucose, Bld 142 (H) 70 - 99 mg/dL    Comment: Glucose reference range applies only to samples taken after fasting for at least 8 hours.   BUN 11 8 - 23 mg/dL   Creatinine, Ser 0.66 0.44 - 1.00 mg/dL   Calcium 8.9 8.9 - 10.3 mg/dL   Total Protein 6.5 6.5 - 8.1 g/dL   Albumin 3.0 (L) 3.5 - 5.0 g/dL   AST 55 (H) 15 - 41 U/L   ALT 102 (H) 0 - 44 U/L   Alkaline Phosphatase 179 (H) 38 - 126 U/L   Total Bilirubin 0.4 0.3 - 1.2 mg/dL   GFR, Estimated >60 >60 mL/min    Comment: (NOTE) Calculated using the CKD-EPI Creatinine Equation (2021)    Anion gap 15 5 - 15    Comment: Performed at [facility], [place] [address], [place], [place] [address]  CBC with Differential     Status: Abnormal   Collection Time: 08/07/20 12:36 PM  Result Value Ref Range   WBC 12.0 (H) 4.0 - 10.5 K/uL  RBC 3.66 (L) 3.87 -  5.11 MIL/uL   Hemoglobin 11.5 (L) 12.0 - 15.0 g/dL   HCT 34.4 (L) 36.0 - 46.0 %   MCV 94.0 80.0 - 100.0 fL   MCH 31.4 26.0 - 34.0 pg   MCHC 33.4 30.0 - 36.0 g/dL   RDW 13.2 11.5 - 15.5 %   Platelets 290 150 - 400 K/uL   nRBC 0.0 0.0 - 0.2 %   Neutrophils Relative % 83 %   Neutro Abs 10.0 (H) 1.7 - 7.7 K/uL   Lymphocytes Relative 5 %   Lymphs Abs 0.6 (L) 0.7 - 4.0 K/uL   Monocytes Relative 9 %   Monocytes Absolute 1.0 0.1 - 1.0 K/uL   Eosinophils Relative 2 %   Eosinophils Absolute 0.3 0.0 - 0.5 K/uL   Basophils Relative 0 %   Basophils Absolute 0.0 0.0 - 0.1 K/uL   Immature Granulocytes 1 %   Abs Immature Granulocytes 0.08 (H) 0.00 - 0.07 K/uL    Comment: Performed at [facility] [address], [place], [place] [address]  Lactic acid, plasma     Status: None   Collection Time: 08/07/20  1:18 PM  Result Value Ref Range   Lactic Acid, Venous 0.9 0.5 - 1.9 mmol/L    Comment: Performed at [facility] [address] [place], [place] [address]  Urinalysis, Routine w reflex microscopic Urine, Clean Catch     Status: None   Collection Time: 08/07/20  1:22 PM  Result Value Ref Range   Color, Urine YELLOW YELLOW   APPearance CLEAR CLEAR   Specific Gravity, Urine 1.014 1.005 - 1.030   pH 5.0 5.0 - 8.0   Glucose, UA NEGATIVE NEGATIVE mg/dL   Hgb urine dipstick NEGATIVE NEGATIVE   Bilirubin Urine NEGATIVE NEGATIVE   Ketones, ur NEGATIVE NEGATIVE mg/dL   Protein, ur NEGATIVE NEGATIVE mg/dL   Nitrite NEGATIVE NEGATIVE   Leukocytes,Ua NEGATIVE NEGATIVE    Comment: Performed at [facility] [address], [place], [place] [address]  Culture, blood (Routine X 2) w Reflex to ID Panel     Status: None (Preliminary result)   Collection Time: 08/08/20 12:42 AM   Specimen: BLOOD  Result Value Ref Range   Specimen Description BLOOD LEFT ANTECUBITAL    Special Requests      BOTTLES DRAWN AEROBIC AND ANAEROBIC Blood Culture adequate volume   Culture      NO GROWTH 1  DAY Performed at [facility], [place] [address], [place], [place] [address]    Report Status PENDING   Resp Panel by RT-PCR (Flu A&B, Covid) Nasopharyngeal Swab     Status: None   Collection Time: 08/08/20  2:03 AM   Specimen: Nasopharyngeal Swab; Nasopharyngeal(NP) swabs in vial transport medium  Result Value Ref Range   SARS Coronavirus 2 by RT PCR NEGATIVE NEGATIVE    Comment: (NOTE) SARS-CoV-2 target nucleic acids are NOT DETECTED.  The SARS-CoV-2 RNA is generally detectable in upper respiratory specimens during the acute phase of infection. The lowest concentration of SARS-CoV-2 viral copies this assay can detect is 138 copies/mL. A negative result does not preclude SARS-Cov-2 infection and should not be used as the sole basis for treatment or other patient management decisions. A negative result may occur with  improper specimen collection/handling, submission of specimen other than nasopharyngeal swab, presence of viral mutation(s) within the areas targeted by this assay, and inadequate number of viral copies(<138 copies/mL). A negative  result must be combined with clinical observations, patient history, and epidemiological information. The expected result is Negative.  Fact Sheet for Patients:  EntrepreneurPulse.com.au  Fact Sheet for Healthcare Providers:  IncredibleEmployment.be  This test is no t yet approved or cleared by the [place] FDA and  has been authorized for detection and/or diagnosis of SARS-CoV-2 by FDA under an Emergency Use Authorization (EUA). This EUA will remain  in effect (meaning this test can be used) for the duration of the COVID-19 declaration under Section 564(b)(1) of the Act, 21 U.S.C.section 360bbb-3(b)(1), unless the authorization is terminated  or revoked sooner.       Influenza A by PCR NEGATIVE NEGATIVE   Influenza B by PCR NEGATIVE NEGATIVE    Comment: (NOTE) The Xpert Xpress  SARS-CoV-2/FLU/RSV plus assay is intended as an aid in the diagnosis of influenza from Nasopharyngeal swab specimens and should not be used as a sole basis for treatment. Nasal washings and aspirates are unacceptable for Xpert Xpress SARS-CoV-2/FLU/RSV testing.  Fact Sheet for Patients: EntrepreneurPulse.com.au  Fact Sheet for Healthcare Providers: IncredibleEmployment.be  This test is not yet approved or cleared by the [place] FDA and has been authorized for detection and/or diagnosis of SARS-CoV-2 by FDA under an Emergency Use Authorization (EUA). This EUA will remain in effect (meaning this test can be used) for the duration of the COVID-19 declaration under Section 564(b)(1) of the Act, 21 U.S.C. section 360bbb-3(b)(1), unless the authorization is terminated or revoked.  Performed at [facility], [place] [address], [place], [place] [address]   HIV Antibody (routine testing w rflx)     Status: None   Collection Time: 08/08/20  4:26 AM  Result Value Ref Range   HIV Screen 4th Generation wRfx Non Reactive Non Reactive    Comment: Performed at [facility], [place] [address] [place], [place] [address]  CBC     Status: Abnormal   Collection Time: 08/08/20  4:26 AM  Result Value Ref Range   WBC 8.8 4.0 - 10.5 K/uL   RBC 3.84 (L) 3.87 - 5.11 MIL/uL   Hemoglobin 11.9 (L) 12.0 - 15.0 g/dL   HCT 35.5 (L) 36.0 - 46.0 %   MCV 92.4 80.0 - 100.0 fL   MCH 31.0 26.0 - 34.0 pg   MCHC 33.5 30.0 - 36.0 g/dL   RDW 13.4 11.5 - 15.5 %   Platelets 234 150 - 400 K/uL   nRBC 0.0 0.0 - 0.2 %    Comment: Performed at [facility], [place] [address] [place], [place] [id number]  Basic metabolic panel     Status: Abnormal   Collection Time: 08/08/20  4:26 AM  Result Value Ref Range   Sodium 137 135 - 145 mmol/L   Potassium 3.2 (L) 3.5 - 5.1 mmol/L   Chloride 105 98 - 111 mmol/L   CO2 20 (L) 22 - 32 mmol/L   Glucose, Bld 129 (H) 70 - 99 mg/dL     Comment: Glucose reference range applies only to samples taken after fasting for at least 8 hours.   BUN 9 8 - 23 mg/dL   Creatinine, Ser 0.62 0.44 - 1.00 mg/dL   Calcium 8.5 (L) 8.9 - 10.3 mg/dL   GFR, Estimated >60 >60 mL/min    Comment: (NOTE) Calculated using the CKD-EPI Creatinine Equation (2021)    Anion gap 12 5 - 15    Comment: Performed at [facility] [address] [place], [place]  [address]  Culture, blood (Routine X 2) w Reflex to ID Panel     Status: None (Preliminary result)   Collection Time: 08/08/20  8:19 AM   Specimen: BLOOD LEFT ARM  Result Value Ref Range   Specimen Description BLOOD LEFT ARM    Special Requests      BOTTLES DRAWN AEROBIC AND ANAEROBIC Blood Culture adequate volume   Culture      NO GROWTH 1 DAY Performed at [facility], [place] [address], [place], [place] [address]    Report Status PENDING       Component Value Date/Time   SDES BLOOD LEFT ARM 08/08/2020 0819   SPECREQUEST  08/08/2020 0819    BOTTLES DRAWN AEROBIC AND ANAEROBIC Blood Culture adequate volume   CULT  08/08/2020 0819    NO GROWTH 1 DAY Performed at [facility], [place] [address], [place], [place] [address]    REPTSTATUS PENDING 08/08/2020 [id number]   [place] CHEST SOFT TISSUE  Result Date: 08/08/2020 CLINICAL DATA:  Erythema and edema at the level right mastectomy incision and associated fever. EXAM: CHEST ULTRASOUND COMPARISON:  None. FINDINGS: Focused soft tissue ultrasound demonstrates subcutaneous fluid over a region extending for approximately 5 cm in length and measuring approximately 1.0-1.3 cm in thickness located approximately 1.5 cm deep to the skin. IMPRESSION: Subcutaneous postoperative fluid in the right chest wall over a length of approximately 5 cm and measuring approximately 1.0-1.3 cm in thickness. Electronically Signed   By: Aletta Edouard M.D.   On: 08/08/2020 09:18   Recent Results (from the past 240 hour(s))  Culture, blood (Routine X 2) w Reflex  to ID Panel     Status: None (Preliminary result)   Collection Time: 08/08/20 12:42 AM   Specimen: BLOOD  Result Value Ref Range Status   Specimen Description BLOOD LEFT ANTECUBITAL  Final   Special Requests   Final    BOTTLES DRAWN AEROBIC AND ANAEROBIC Blood Culture adequate volume   Culture   Final    NO GROWTH 1 DAY Performed at [facility], [address], [place], [place] [address]    Report Status PENDING  Incomplete  Resp Panel by RT-PCR (Flu A&B, Covid) Nasopharyngeal Swab     Status: None   Collection Time: 08/08/20  2:03 AM   Specimen: Nasopharyngeal Swab; Nasopharyngeal(NP) swabs in vial transport medium  Result Value Ref Range Status   SARS Coronavirus 2 by RT PCR NEGATIVE NEGATIVE Final    Comment: (NOTE) SARS-CoV-2 target nucleic acids are NOT DETECTED.  The SARS-CoV-2 RNA is generally detectable in upper respiratory specimens during the acute phase of infection. The lowest concentration of SARS-CoV-2 viral copies this assay can detect is 138 copies/mL. A negative result does not preclude SARS-Cov-2 infection and should not be used as the sole basis for treatment or other patient management decisions. A negative result may occur with  improper specimen collection/handling, submission of specimen other than nasopharyngeal swab, presence of viral mutation(s) within the areas targeted by this assay, and inadequate number of viral copies(<138 copies/mL). A negative result must be combined with clinical observations, patient history, and epidemiological information. The expected result is Negative.  Fact Sheet for Patients:  EntrepreneurPulse.com.au  Fact Sheet for Healthcare Providers:  IncredibleEmployment.be  This test is no t yet approved or cleared by the [place] FDA and  has been authorized for detection and/or diagnosis of SARS-CoV-2 by FDA under an Emergency Use Authorization (EUA). This EUA will remain  in  effect (meaning this test can be used) for the duration of the COVID-19 declaration under Section 564(b)(1) of the Act, 21 U.S.C.section 360bbb-3(b)(1), unless the authorization is terminated  or revoked sooner.       Influenza A by PCR NEGATIVE NEGATIVE Final   Influenza B by PCR NEGATIVE NEGATIVE Final    Comment: (NOTE) The Xpert Xpress SARS-CoV-2/FLU/RSV plus assay is intended as an aid in the diagnosis of influenza from Nasopharyngeal swab specimens and should not be used as a sole basis for treatment. Nasal washings and aspirates are unacceptable for Xpert Xpress SARS-CoV-2/FLU/RSV testing.  Fact Sheet for Patients: EntrepreneurPulse.com.au  Fact Sheet for Healthcare Providers: IncredibleEmployment.be  This test is not yet approved or cleared by the [place] FDA and has been authorized for detection and/or diagnosis of SARS-CoV-2 by FDA under an Emergency Use Authorization (EUA). This EUA will remain in effect (meaning this test can be used) for the duration of the COVID-19 declaration under Section 564(b)(1) of the Act, 21 U.S.C. section 360bbb-3(b)(1), unless the authorization is terminated or revoked.  Performed at [facility], [place] [address], [place], [place] [address]   Culture, blood (Routine X 2) w Reflex to ID Panel     Status: None (Preliminary result)   Collection Time: 08/08/20  8:19 AM   Specimen: BLOOD LEFT ARM  Result Value Ref Range Status   Specimen Description BLOOD LEFT ARM  Final   Special Requests   Final    BOTTLES DRAWN AEROBIC AND ANAEROBIC Blood Culture adequate volume   Culture   Final    NO GROWTH 1 DAY Performed at [facility], [place] [address] [place], [place] [address]    Report Status PENDING  Incomplete    Microbiology: Recent Results (from the past 240 hour(s))  Culture, blood (Routine X 2) w Reflex to ID Panel     Status: None (Preliminary result)   Collection Time: 08/08/20  12:42 AM   Specimen: BLOOD  Result Value Ref Range Status   Specimen Description BLOOD LEFT ANTECUBITAL  Final   Special Requests   Final    BOTTLES DRAWN AEROBIC AND ANAEROBIC Blood Culture adequate volume   Culture   Final    NO GROWTH 1 DAY Performed at [facility], [place] [address], [place], [place] [address]    Report Status PENDING  Incomplete  Resp Panel by RT-PCR (Flu A&B, Covid) Nasopharyngeal Swab     Status: None   Collection Time: 08/08/20  2:03 AM   Specimen: Nasopharyngeal Swab; Nasopharyngeal(NP) swabs in vial transport medium  Result Value Ref Range Status   SARS Coronavirus 2 by RT PCR NEGATIVE NEGATIVE Final    Comment: (NOTE) SARS-CoV-2 target nucleic acids are NOT DETECTED.  The SARS-CoV-2 RNA is generally detectable in upper respiratory specimens during the acute phase of infection. The lowest concentration of SARS-CoV-2 viral copies this assay can detect is 138 copies/mL. A negative result does not preclude SARS-Cov-2 infection and should not be used as the sole basis for treatment or other patient management decisions. A negative result may occur with  improper specimen collection/handling, submission of specimen other than nasopharyngeal swab, presence of viral mutation(s) within the areas targeted by this assay, and inadequate number of viral copies(<138 copies/mL). A negative result must be combined with clinical observations, patient history, and epidemiological information. The expected result is Negative.  Fact Sheet for Patients:  EntrepreneurPulse.com.au  Fact Sheet for Healthcare Providers:  IncredibleEmployment.be  This  test is no t yet approved or cleared by the [place] and  has been authorized for detection and/or diagnosis of SARS-CoV-2 by FDA under an Emergency Use Authorization (EUA). This EUA will remain  in effect (meaning this test can be used) for the duration of the COVID-19  declaration under Section 564(b)(1) of the Act, 21 U.S.C.section 360bbb-3(b)(1), unless the authorization is terminated  or revoked sooner.       Influenza A by PCR NEGATIVE NEGATIVE Final   Influenza B by PCR NEGATIVE NEGATIVE Final    Comment: (NOTE) The Xpert Xpress SARS-CoV-2/FLU/RSV plus assay is intended as an aid in the diagnosis of influenza from Nasopharyngeal swab specimens and should not be used as a sole basis for treatment. Nasal washings and aspirates are unacceptable for Xpert Xpress SARS-CoV-2/FLU/RSV testing.  Fact Sheet for Patients: EntrepreneurPulse.com.au  Fact Sheet for Healthcare Providers: IncredibleEmployment.be  This test is not yet approved or cleared by the [place] FDA and has been authorized for detection and/or diagnosis of SARS-CoV-2 by FDA under an Emergency Use Authorization (EUA). This EUA will remain in effect (meaning this test can be used) for the duration of the COVID-19 declaration under Section 564(b)(1) of the Act, 21 U.S.C. section 360bbb-3(b)(1), unless the authorization is terminated or revoked.  Performed at [facility], [place] [address] [place], [place] [address]   Culture, blood (Routine X 2) w Reflex to ID Panel     Status: None (Preliminary result)   Collection Time: 08/08/20  8:19 AM   Specimen: BLOOD LEFT ARM  Result Value Ref Range Status   Specimen Description BLOOD LEFT ARM  Final   Special Requests   Final    BOTTLES DRAWN AEROBIC AND ANAEROBIC Blood Culture adequate volume   Culture   Final    NO GROWTH 1 DAY Performed at [facility], [place] [address] [place],  [address]    Report Status PENDING  Incomplete    Radiographs and labs were personally reviewed by me.   Bobby Rumpf, MD [facility] [phone] 08/09/2020, 11:47 AM

## 2020-08-09 NOTE — Progress Notes (Addendum)
Pharmacy Antibiotic Note  Haley [name] is a 70 y.o. female admitted on 08/07/2020 with R breast cellulitis/abscess s/p double mastectomy on 12/16. Patient was started on vancomycin/cefepime on 1/1. Today pt c/o worsening blister at R breast. Pharmacy has been consulted to switch cefepime to meropenem.  Patient is afebrile, Tmax 100.2. WBC down to 8.8. Renal function stable. Patient had reaction to penicillin in 8th-9th grade but tolerates Keflex. Abscess culture obtained at bedside. ID consulted.  Plan: Start meropenem 1 gm IV q8h Continue vancomycin 750 mg IV q12h F/u cultures, clinical progress, and surgical plans F/u abx de-escalation   Height: 5\' 8"  (172.7 cm) Weight: 79.4 kg (175 lb) IBW/kg (Calculated) : 63.9  Temp (24hrs), Avg:98.6 F (37 C), Min:98 F (36.7 C), Max:100.2 F (37.9 C)  Recent Labs  Lab 08/07/20 1236 08/07/20 1318 08/08/20 0426  WBC 12.0*  --  8.8  CREATININE 0.66  --  0.62  LATICACIDVEN  --  0.9  --     Estimated Creatinine Clearance: 73.4 mL/min (by C-G formula based on SCr of 0.62 mg/dL).    Allergies  Allergen Reactions  . Cephalexin Diarrhea  . Hydrochlorothiazide     Other reaction(s): hyponatremia  . Lisinopril Cough  . Potassium Chloride Other (See Comments)    Stomach irritation  . Sulfamethoxazole-Trimethoprim Other (See Comments)    Stomach irritated  . Tape Itching  . Clindamycin Hives  . Penicillin G Rash and Other (See Comments)    Has patient had a PCN reaction causing immediate rash, facial/tongue/throat swelling, SOB or lightheadedness with hypotension: Yes Has patient had a PCN reaction causing severe rash involving mucus membranes or skin necrosis: No Has patient had a PCN reaction that required hospitalization: No Has patient had a PCN reaction occurring within the last 10 years: No If all of the above answers are "NO", then may proceed with Cephalosporin use.     Antimicrobials this admission: Vancomycin 1/1 >>   Cefepime 1/1 >> 1/2 Meropenem 1/2 >>  Microbiology results: 1/1 BCx: NGTD 1/2 Abscess cx: GPC in clusters on Gram stain  Thank you for allowing pharmacy to be a part of this patient's care.  10/06/20, PharmD, BCPS PGY2 Pharmacy Resident Phone between 7 am - 3:30 pm: Lulu Riding  Please check AMION for all [facility] Pharmacy phone numbers After 10:00 PM, call Main Pharmacy [phone]  08/09/2020 9:45 AM

## 2020-08-09 NOTE — Procedures (Signed)
Interventional Radiology Procedure Note  Procedure: US guided placement of a 3F drain into the fluid collection along the right mastectomy incision.  25 mL purulent fluid removed, sent for culture.   Complications: None  Estimated Blood Loss: None  Recommendations: - Drain to JP - Flush drain Q shift - F/U in IR drain clinic in 1 week   Signed,  Sterling Big, MD

## 2020-08-09 NOTE — Progress Notes (Signed)
Dr. Daphine Deutscher evaluated Ms. Haley [name] this morning and we have discussed her case.  The patient been seen and examined.  Her cellulitis around her right mastectomy wound initially improved with antibiotics, however it seemed to worsen some over the last 12 hours.  Right breast ultrasound demonstrated a small fluid collection which Dr. Daphine Deutscher was able to aspirate a few cc's from this morning at the bedside.  I discussed with the patient the option of proceeding with incision and drainage of this fluid collection, versus attempting interventional radiology image guided aspiration or drain placement.  I reviewed the case with the interventional radiologist on-call who stated he would be willing to attempt aspiration with possible drain placement.  Her white count is improved and she is hemodynamically stable, I feel giving her a chance with interventional radiology may give her the opportunity to avoid a wound that would take a while to heal and provide a better cosmetic appearance.  She may require incision and drainage in the future but we will hold off at this time and attempt an interventional radiology approach.

## 2020-08-09 NOTE — Progress Notes (Signed)
Patient ID: Haley [name], female   DOB: 1951/01/06, 70 y.o.   MRN: [id number] [facility] Progress Note:   * No surgery found *  Subjective: Mental status is alert.  Complaints had soaking night sweat last night. Objective: Vital signs in last 24 hours: Temp:  [98 F (36.7 C)-100.2 F (37.9 C)] 99.6 F (37.6 C) (01/02 0901) Pulse Rate:  [77-90] 90 (01/02 0753) Resp:  [17-20] 20 (01/02 0753) BP: (112-132)/(55-64) 132/62 (01/02 0753) SpO2:  [96 %-98 %] 96 % (01/02 0753)  Intake/Output from previous day: 01/01 0701 - 01/02 0700 In: 1039.7 [P.O.:240; I.V.:549.7; IV Piggyback:250] Out: 201 [Urine:201] Intake/Output this shift: Total I/O In: 1089.6 [I.V.:739.6; IV Piggyback:350] Out: -   Physical Exam: Work of breathing is normal.  Right flap is much more red today with edema and faint fluid wave.    Lab Results:  Results for orders placed or performed during the hospital encounter of 08/07/20 (from the past 48 hour(s))  Comprehensive metabolic panel     Status: Abnormal   Collection Time: 08/07/20 12:36 PM  Result Value Ref Range   Sodium 135 135 - 145 mmol/L   Potassium 3.1 (L) 3.5 - 5.1 mmol/L   Chloride 103 98 - 111 mmol/L   CO2 17 (L) 22 - 32 mmol/L   Glucose, Bld 142 (H) 70 - 99 mg/dL    Comment: Glucose reference range applies only to samples taken after fasting for at least 8 hours.   BUN 11 8 - 23 mg/dL   Creatinine, Ser 0.66 0.44 - 1.00 mg/dL   Calcium 8.9 8.9 - 10.3 mg/dL   Total Protein 6.5 6.5 - 8.1 g/dL   Albumin 3.0 (L) 3.5 - 5.0 g/dL   AST 55 (H) 15 - 41 U/L   ALT 102 (H) 0 - 44 U/L   Alkaline Phosphatase 179 (H) 38 - 126 U/L   Total Bilirubin 0.4 0.3 - 1.2 mg/dL   GFR, Estimated >60 >60 mL/min    Comment: (NOTE) Calculated using the CKD-EPI Creatinine Equation (2021)    Anion gap 15 5 - 15    Comment: Performed at [facility], [place] [address], [place], [place] [address]  CBC with Differential     Status: Abnormal   Collection Time:  08/07/20 12:36 PM  Result Value Ref Range   WBC 12.0 (H) 4.0 - 10.5 K/uL   RBC 3.66 (L) 3.87 - 5.11 MIL/uL   Hemoglobin 11.5 (L) 12.0 - 15.0 g/dL   HCT 34.4 (L) 36.0 - 46.0 %   MCV 94.0 80.0 - 100.0 fL   MCH 31.4 26.0 - 34.0 pg   MCHC 33.4 30.0 - 36.0 g/dL   RDW 13.2 11.5 - 15.5 %   Platelets 290 150 - 400 K/uL   nRBC 0.0 0.0 - 0.2 %   Neutrophils Relative % 83 %   Neutro Abs 10.0 (H) 1.7 - 7.7 K/uL   Lymphocytes Relative 5 %   Lymphs Abs 0.6 (L) 0.7 - 4.0 K/uL   Monocytes Relative 9 %   Monocytes Absolute 1.0 0.1 - 1.0 K/uL   Eosinophils Relative 2 %   Eosinophils Absolute 0.3 0.0 - 0.5 K/uL   Basophils Relative 0 %   Basophils Absolute 0.0 0.0 - 0.1 K/uL   Immature Granulocytes 1 %   Abs Immature Granulocytes 0.08 (H) 0.00 - 0.07 K/uL    Comment: Performed at [facility], [address] [place], [place] [address]  Lactic acid,  plasma     Status: None   Collection Time: 08/07/20  1:18 PM  Result Value Ref Range   Lactic Acid, Venous 0.9 0.5 - 1.9 mmol/L    Comment: Performed at [facility] [address] [place], [place] [address]  Urinalysis, Routine w reflex microscopic Urine, Clean Catch     Status: None   Collection Time: 08/07/20  1:22 PM  Result Value Ref Range   Color, Urine YELLOW YELLOW   APPearance CLEAR CLEAR   Specific Gravity, Urine 1.014 1.005 - 1.030   pH 5.0 5.0 - 8.0   Glucose, UA NEGATIVE NEGATIVE mg/dL   Hgb urine dipstick NEGATIVE NEGATIVE   Bilirubin Urine NEGATIVE NEGATIVE   Ketones, ur NEGATIVE NEGATIVE mg/dL   Protein, ur NEGATIVE NEGATIVE mg/dL   Nitrite NEGATIVE NEGATIVE   Leukocytes,Ua NEGATIVE NEGATIVE    Comment: Performed at [facility] [address] [place], [place] [address]  Culture, blood (Routine X 2) w Reflex to ID Panel     Status: None (Preliminary result)   Collection Time: 08/08/20 12:42 AM   Specimen: BLOOD  Result Value Ref Range   Specimen Description BLOOD LEFT ANTECUBITAL    Special Requests       BOTTLES DRAWN AEROBIC AND ANAEROBIC Blood Culture adequate volume   Culture      NO GROWTH 1 DAY Performed at [facility], [place] [address], [place], [place] [address]    Report Status PENDING   Resp Panel by RT-PCR (Flu A&B, Covid) Nasopharyngeal Swab     Status: None   Collection Time: 08/08/20  2:03 AM   Specimen: Nasopharyngeal Swab; Nasopharyngeal(NP) swabs in vial transport medium  Result Value Ref Range   SARS Coronavirus 2 by RT PCR NEGATIVE NEGATIVE    Comment: (NOTE) SARS-CoV-2 target nucleic acids are NOT DETECTED.  The SARS-CoV-2 RNA is generally detectable in upper respiratory specimens during the acute phase of infection. The lowest concentration of SARS-CoV-2 viral copies this assay can detect is 138 copies/mL. A negative result does not preclude SARS-Cov-2 infection and should not be used as the sole basis for treatment or other patient management decisions. A negative result may occur with  improper specimen collection/handling, submission of specimen other than nasopharyngeal swab, presence of viral mutation(s) within the areas targeted by this assay, and inadequate number of viral copies(<138 copies/mL). A negative result must be combined with clinical observations, patient history, and epidemiological information. The expected result is Negative.  Fact Sheet for Patients:  EntrepreneurPulse.com.au  Fact Sheet for Healthcare Providers:  IncredibleEmployment.be  This test is no t yet approved or cleared by the [place] FDA and  has been authorized for detection and/or diagnosis of SARS-CoV-2 by FDA under an Emergency Use Authorization (EUA). This EUA will remain  in effect (meaning this test can be used) for the duration of the COVID-19 declaration under Section 564(b)(1) of the Act, 21 U.S.C.section 360bbb-3(b)(1), unless the authorization is terminated  or revoked sooner.       Influenza A by PCR NEGATIVE  NEGATIVE   Influenza B by PCR NEGATIVE NEGATIVE    Comment: (NOTE) The Xpert Xpress SARS-CoV-2/FLU/RSV plus assay is intended as an aid in the diagnosis of influenza from Nasopharyngeal swab specimens and should not be used as a sole basis for treatment. Nasal washings and aspirates are unacceptable for Xpert Xpress SARS-CoV-2/FLU/RSV testing.  Fact Sheet for Patients: EntrepreneurPulse.com.au  Fact Sheet for Healthcare Providers: IncredibleEmployment.be  This test is  not yet approved or cleared by the [place] and has been authorized for detection and/or diagnosis of SARS-CoV-2 by FDA under an Emergency Use Authorization (EUA). This EUA will remain in effect (meaning this test can be used) for the duration of the COVID-19 declaration under Section 564(b)(1) of the Act, 21 U.S.C. section 360bbb-3(b)(1), unless the authorization is terminated or revoked.  Performed at [facility], [place] [address] [place], [place] [address]   HIV Antibody (routine testing w rflx)     Status: None   Collection Time: 08/08/20  4:26 AM  Result Value Ref Range   HIV Screen 4th Generation wRfx Non Reactive Non Reactive    Comment: Performed at [facility], [place] [address] [place], [place] [address]  CBC     Status: Abnormal   Collection Time: 08/08/20  4:26 AM  Result Value Ref Range   WBC 8.8 4.0 - 10.5 K/uL   RBC 3.84 (L) 3.87 - 5.11 MIL/uL   Hemoglobin 11.9 (L) 12.0 - 15.0 g/dL   HCT 35.5 (L) 36.0 - 46.0 %   MCV 92.4 80.0 - 100.0 fL   MCH 31.0 26.0 - 34.0 pg   MCHC 33.5 30.0 - 36.0 g/dL   RDW 13.4 11.5 - 15.5 %   Platelets 234 150 - 400 K/uL   nRBC 0.0 0.0 - 0.2 %    Comment: Performed at [facility], [place] [address], [place], [place] [id number]  Basic metabolic panel     Status: Abnormal   Collection Time: 08/08/20  4:26 AM  Result Value Ref Range   Sodium 137 135 - 145 mmol/L   Potassium 3.2 (L) 3.5 - 5.1 mmol/L   Chloride  105 98 - 111 mmol/L   CO2 20 (L) 22 - 32 mmol/L   Glucose, Bld 129 (H) 70 - 99 mg/dL    Comment: Glucose reference range applies only to samples taken after fasting for at least 8 hours.   BUN 9 8 - 23 mg/dL   Creatinine, Ser 0.62 0.44 - 1.00 mg/dL   Calcium 8.5 (L) 8.9 - 10.3 mg/dL   GFR, Estimated >60 >60 mL/min    Comment: (NOTE) Calculated using the CKD-EPI Creatinine Equation (2021)    Anion gap 12 5 - 15    Comment: Performed at [facility] [address] [place], [place] [address]  Culture, blood (Routine X 2) w Reflex to ID Panel     Status: None (Preliminary result)   Collection Time: 08/08/20  8:19 AM   Specimen: BLOOD LEFT ARM  Result Value Ref Range   Specimen Description BLOOD LEFT ARM    Special Requests      BOTTLES DRAWN AEROBIC AND ANAEROBIC Blood Culture adequate volume   Culture      NO GROWTH 1 DAY Performed at [facility], [place] [address] [place], [place] [address]    Report Status PENDING     Radiology/Results: [place] CHEST SOFT TISSUE  Result Date: 08/08/2020 CLINICAL DATA:  Erythema and edema at the level right mastectomy incision and associated fever. EXAM: CHEST ULTRASOUND COMPARISON:  None. FINDINGS: Focused soft tissue ultrasound demonstrates subcutaneous fluid over a region extending for approximately 5 cm in length and measuring approximately 1.0-1.3 cm in thickness located approximately 1.5 cm deep to the skin. IMPRESSION: Subcutaneous postoperative fluid in the right chest wall over a length of approximately 5 cm and measuring approximately 1.0-1.3 cm in thickness. Electronically Signed  By: Irish Lack M.D.   On: 08/08/2020 09:18    Anti-infectives: Anti-infectives (From admission, onward)   Start     Dose/Rate Route Frequency Ordered Stop   08/09/20 1400  meropenem (MERREM) 1 g in sodium chloride 0.9 % 100 mL IVPB        1 g 200 mL/hr over 30 Minutes Intravenous Every 8 hours 08/09/20 0944     08/08/20 0200  vancomycin  (VANCOREADY) IVPB 750 mg/150 mL  Status:  Discontinued        750 mg 150 mL/hr over 60 Minutes Intravenous Every 12 hours 08/08/20 0151 08/09/20 0942   08/08/20 0200  ceFEPIme (MAXIPIME) 2 g in sodium chloride 0.9 % 100 mL IVPB  Status:  Discontinued        2 g 200 mL/hr over 30 Minutes Intravenous Every 8 hours 08/08/20 0151 08/09/20 5053      Assessment/Plan: Problem List: Patient Active Problem List   Diagnosis Date Noted  . Wound infection 08/08/2020  . Greater trochanteric bursitis of right hip 05/23/2018  . Trochanteric bursitis, right hip 05/23/2018    I prepped flap with betadine and aspirated 8 cc of sanguinopurulent fluid consistent with an infected seroma.  Since she initially responded to Vancomycin, I suspect that she has a polymicrobial infection and after discussion with pharmacy, will change to Merripenam.  She also developed a blister on the superior flap ~ 1 cm in diameter and this may represent an exotoxin production such as produced by Strep.  Stat gram stain and culture performed.  Have posted for early afternoon to open and drain (she has a bite of bagel and a sip of coffee at ~8).   * No surgery found *    LOS: 1 day   Matt B. Daphine Deutscher, MD, [facility] [phone] to reach the surgeon on call.    08/09/2020 9:58 AM

## 2020-08-10 DIAGNOSIS — L089 Local infection of the skin and subcutaneous tissue, unspecified: Secondary | ICD-10-CM | POA: Diagnosis not present

## 2020-08-10 DIAGNOSIS — N611 Abscess of the breast and nipple: Secondary | ICD-10-CM | POA: Diagnosis not present

## 2020-08-10 DIAGNOSIS — T148XXA Other injury of unspecified body region, initial encounter: Secondary | ICD-10-CM | POA: Diagnosis not present

## 2020-08-10 LAB — BASIC METABOLIC PANEL
Anion gap: 13 (ref 5–15)
BUN: 8 mg/dL (ref 8–23)
CO2: 23 mmol/L (ref 22–32)
Calcium: 8.5 mg/dL — ABNORMAL LOW (ref 8.9–10.3)
Chloride: 105 mmol/L (ref 98–111)
Creatinine, Ser: 0.6 mg/dL (ref 0.44–1.00)
GFR, Estimated: >60 mL/min (ref >60)
Glucose, Bld: 106 mg/dL — ABNORMAL HIGH (ref 70–99)
Potassium: 3.2 mmol/L — ABNORMAL LOW (ref 3.5–5.1)
Sodium: 141 mmol/L (ref 135–145)

## 2020-08-10 MED ORDER — SODIUM CHLORIDE 0.9% FLUSH
3.0000 mL | Freq: Two times a day (BID) | INTRAVENOUS | Status: DC
  Administered 2020-08-10 – 2020-08-11 (×3): 3 mL via INTRAVENOUS

## 2020-08-10 MED ORDER — SODIUM CHLORIDE 0.9% FLUSH: 3.0000 mL | INTRAVENOUS | Status: DC | PRN

## 2020-08-10 NOTE — Plan of Care (Signed)
  Problem: Education: Goal: Knowledge of General Education information will improve Description: Including pain rating scale, medication(s)/side effects and non-pharmacologic comfort measures Outcome: Progressing   Problem: Clinical Measurements: Goal: Ability to maintain clinical measurements within normal limits will improve Outcome: Progressing   Problem: Coping: Goal: Level of anxiety will decrease Outcome: Progressing   Problem: Pain Managment: Goal: General experience of comfort will improve Outcome: Progressing   Problem: Safety: Goal: Ability to remain free from injury will improve Outcome: Progressing   Problem: Skin Integrity: Goal: Risk for impaired skin integrity will decrease Outcome: Progressing   

## 2020-08-10 NOTE — Plan of Care (Signed)

## 2020-08-10 NOTE — Plan of Care (Signed)

## 2020-08-10 NOTE — Progress Notes (Signed)
1 Day Post-Op   Subjective/Chief Complaint: Doing better  Fever gone  Feels better  Drain in place  s aureus on cultures    Objective: Vital signs in last 24 hours: Temp:  [98 F (36.7 C)-100.7 F (38.2 C)] 98.4 F (36.9 C) (01/03 0716) Pulse Rate:  [80-97] 80 (01/03 0716) Resp:  [16-18] 16 (01/03 0716) BP: (131-152)/(64-79) 136/72 (01/03 0716) SpO2:  [95 %-97 %] 96 % (01/03 0716) Last BM Date: 09/06/20  Intake/Output from previous day: 01/02 0701 - 01/03 0700 In: 2659.5 [P.O.:360; I.V.:1576.8; IV Piggyback:717.7] Out: 39 [Drains:39] Intake/Output this shift: Total I/O In: 439 [I.V.:189; IV Piggyback:250] Out: 11 [Drains:11]  Incision/Wound:R mastectomy erythema less  JP serous no blistering or crepetene  Left side drain in place CDI   Lab Results:  Recent Labs    08/07/20 1236 08/08/20 0426  WBC 12.0* 8.8  HGB 11.5* 11.9*  HCT 34.4* 35.5*  PLT 290 234   BMET Recent Labs    08/08/20 0426 08/10/20 0437  NA 137 141  K 3.2* 3.2*  CL 105 105  CO2 20* 23  GLUCOSE 129* 106*  BUN 9 8  CREATININE 0.62 0.60  CALCIUM 8.5* 8.5*   PT/INR No results for input(s): LABPROT, INR in the last 72 hours. ABG No results for input(s): PHART, HCO3 in the last 72 hours.  Invalid input(s): PCO2, PO2  Studies/Results: [place] IMAGE GUIDED DRAINAGE BY PERCUTANEOUS CATHETER  Result Date: 08/09/2020 INDICATION: 70 year old female with a history of breast cancer status post simple bilateral mastectomies. Her right mastectomy incision is complicated by extensive cellulitis and a complex fluid collection concerning for abscess. She presents for drain placement. EXAM: Ultrasound-guided drain placement MEDICATIONS: The patient is currently admitted to the hospital and receiving intravenous antibiotics. The antibiotics were administered within an appropriate time frame prior to the initiation of the procedure. ANESTHESIA/SEDATION: None. COMPLICATIONS: None immediate. PROCEDURE: Informed  written consent was obtained from the patient after a thorough discussion of the procedural risks, benefits and alternatives. All questions were addressed. Maximal Sterile Barrier Technique was utilized including caps, mask, sterile gowns, sterile gloves, sterile drape, hand hygiene and skin antiseptic. A timeout was performed prior to the initiation of the procedure. Ultrasound was used to interrogate the incision. A large mildly complex fluid collection can be seen in the subcutaneous soft tissues running longitudinal E with the incision. The overlying skin was sterilely prepped and draped in the standard fashion using chlorhexidine skin prep. Local anesthesia was attained by infiltration with 1% lidocaine. A small dermatotomy was made. Under real-time ultrasound guidance, a 17 gauge introducer needle was advanced into the fluid collection. A 0.035 wire was then advanced into the fluid collection. The needle was removed. The skin tract was dilated to 12 [place]. A Cook 12 [place] all-purpose drainage catheter was advanced over the wire and formed. Aspiration yields 25 mL purulent appearing fluid. Samples were sent for Gram stain and culture. The catheter was gently flushed before being connected to JP bulb suction. The catheter was secured to the skin with 0 Prolene suture. A sterile bandage was applied. IMPRESSION: Successful placement of a 12 French drainage catheter. Aspiration yields 25 mL purulent fluid. A sample was sent for culture. PLAN: 1. Maintain drain to JP bulb suction. 2. Flush drainage catheter at least once per shift. 3. Once cellulitis and clinical picture are improved and drain output is minimal (less than 10 mL daily) the drainage catheter may be removed. Electronically Signed   By: Jacqulynn Cadet  M.D.   On: 08/09/2020 19:19    Anti-infectives: Anti-infectives (From admission, onward)   Start     Dose/Rate Route Frequency Ordered Stop   08/09/20 1730  vancomycin (VANCOREADY) IVPB 750  mg/150 mL        750 mg 150 mL/hr over 60 Minutes Intravenous Every 12 hours 08/09/20 1456     08/09/20 1400  meropenem (MERREM) 1 g in sodium chloride 0.9 % 100 mL IVPB  Status:  Discontinued        1 g 200 mL/hr over 30 Minutes Intravenous Every 8 hours 08/09/20 0944 08/10/20 1019   08/08/20 0200  vancomycin (VANCOREADY) IVPB 750 mg/150 mL  Status:  Discontinued        750 mg 150 mL/hr over 60 Minutes Intravenous Every 12 hours 08/08/20 0151 08/09/20 0942   08/08/20 0200  ceFEPIme (MAXIPIME) 2 g in sodium chloride 0.9 % 100 mL IVPB  Status:  Discontinued        2 g 200 mL/hr over 30 Minutes Intravenous Every 8 hours 08/08/20 0151 08/09/20 0942      Assessment/Plan: A/P B  MASTECTOMY NOW WITH RIGHT SIDE CELLULITIS - IMPROVING  CONT ABX Await cultures  SL IV    LOS: 2 days    Haley [name] 08/10/2020

## 2020-08-10 NOTE — Progress Notes (Signed)
[facility]  Date of Admission:  08/07/2020           Reason for visit: Follow up on wound infection  Interval events: S/p IR drain placement Fever curve improved Cultures = Staph aureus   ASSESSMENT:    # Right mastectomy wound infection/abscess with Staph aureus s/p drain placement 08/09/20 # Breast cancer # Penicillin allergy  PLAN:    -- continue vancomycin -- stop meropenem -- await susceptibilities -- monitor drain output per IR  MEDICATIONS:    Scheduled Meds: . acetaminophen  1,000 mg Oral Q6H  . amLODipine  5 mg Oral Daily  . enoxaparin (LOVENOX) injection  40 mg Subcutaneous Q24H  . ibuprofen  400 mg Oral TID  . irbesartan  300 mg Oral Daily  . simvastatin  20 mg Oral q1800  . sodium chloride flush  3 mL Intravenous Q12H  . sodium chloride flush  5 mL Intracatheter Q8H    Continuous Infusions: . vancomycin Stopped (08/10/20 0714)    PRN Meds: ALPRAZolam, ondansetron **OR** ondansetron (ZOFRAN) IV, sodium chloride flush, traMADol, white petrolatum, zolpidem  SUBJECTIVE:   She is feeling better this morning.   Review of Systems  Constitutional: Negative for chills and fever.  Respiratory: Negative.   Cardiovascular: Negative.   Skin: Negative.        + Wound     OBJECTIVE:   Allergies  Allergen Reactions  . Cephalexin Diarrhea  . Hydrochlorothiazide     Other reaction(s): hyponatremia  . Lisinopril Cough  . Potassium Chloride Other (See Comments)    Stomach irritation  . Sulfamethoxazole-Trimethoprim Other (See Comments)    Stomach irritated  . Tape Itching  . Clindamycin Hives  . Penicillin G Rash and Other (See Comments)    Has patient had a PCN reaction causing immediate rash, facial/tongue/throat swelling, SOB or lightheadedness with hypotension: Yes Has patient had a PCN reaction causing severe rash involving mucus membranes or skin necrosis: No Has patient had a PCN reaction that required  hospitalization: No Has patient had a PCN reaction occurring within the last 10 years: No If all of the above answers are "NO", then may proceed with Cephalosporin use.     Blood pressure 136/72, pulse 80, temperature 98.4 F (36.9 C), temperature source Oral, resp. rate 16, height 5\' 8"  (1.727 m), weight 79.4 kg, SpO2 96 %. Body mass index is 26.61 kg/m.  Physical Exam Constitutional:      General: She is not in acute distress.    Appearance: Normal appearance.  HENT:     Head: Normocephalic and atraumatic.  Pulmonary:     Effort: Pulmonary effort is normal. No respiratory distress.  Neurological:     General: No focal deficit present.     Mental Status: She is alert and oriented to person, place, and time.  Psychiatric:        Mood and Affect: Mood normal.        Behavior: Behavior normal.      Lab Results & Microbiology Lab Results  Component Value Date   WBC 8.8 08/08/2020   HGB 11.9 (L) 08/08/2020   HCT 35.5 (L) 08/08/2020   MCV 92.4 08/08/2020   PLT 234 08/08/2020    Lab Results  Component Value Date   NA 141 08/10/2020   K 3.2 (L) 08/10/2020   CO2 23 08/10/2020   GLUCOSE 106 (H) 08/10/2020   BUN 8 08/10/2020   CREATININE 0.60 08/10/2020   CALCIUM  8.5 (L) 08/10/2020   GFRNONAA >60 08/10/2020   GFRAA >60 04/16/2019    Lab Results  Component Value Date   ALT 102 (H) 08/07/2020   AST 55 (H) 08/07/2020   ALKPHOS 179 (H) 08/07/2020   BILITOT 0.4 08/07/2020     I have reviewed the micro and lab results in Epic.  Imaging [place] IMAGE GUIDED DRAINAGE BY PERCUTANEOUS CATHETER  Result Date: 08/09/2020 INDICATION: 70 year old female with a history of breast cancer status post simple bilateral mastectomies. Her right mastectomy incision is complicated by extensive cellulitis and a complex fluid collection concerning for abscess. She presents for drain placement. EXAM: Ultrasound-guided drain placement MEDICATIONS: The patient is currently admitted to the hospital  and receiving intravenous antibiotics. The antibiotics were administered within an appropriate time frame prior to the initiation of the procedure. ANESTHESIA/SEDATION: None. COMPLICATIONS: None immediate. PROCEDURE: Informed written consent was obtained from the patient after a thorough discussion of the procedural risks, benefits and alternatives. All questions were addressed. Maximal Sterile Barrier Technique was utilized including caps, mask, sterile gowns, sterile gloves, sterile drape, hand hygiene and skin antiseptic. A timeout was performed prior to the initiation of the procedure. Ultrasound was used to interrogate the incision. A large mildly complex fluid collection can be seen in the subcutaneous soft tissues running longitudinal E with the incision. The overlying skin was sterilely prepped and draped in the standard fashion using chlorhexidine skin prep. Local anesthesia was attained by infiltration with 1% lidocaine. A small dermatotomy was made. Under real-time ultrasound guidance, a 17 gauge introducer needle was advanced into the fluid collection. A 0.035 wire was then advanced into the fluid collection. The needle was removed. The skin tract was dilated to 12 [place]. A Cook 12 [place] all-purpose drainage catheter was advanced over the wire and formed. Aspiration yields 25 mL purulent appearing fluid. Samples were sent for Gram stain and culture. The catheter was gently flushed before being connected to JP bulb suction. The catheter was secured to the skin with 0 Prolene suture. A sterile bandage was applied. IMPRESSION: Successful placement of a 12 French drainage catheter. Aspiration yields 25 mL purulent fluid. A sample was sent for culture. PLAN: 1. Maintain drain to JP bulb suction. 2. Flush drainage catheter at least once per shift. 3. Once cellulitis and clinical picture are improved and drain output is minimal (less than 10 mL daily) the drainage catheter may be removed. Electronically  Signed   By: Jacqulynn Cadet M.D.   On: 08/09/2020 19:19       Haley [name] for [facility] [phone] pager 08/10/2020, 11:24 AM

## 2020-08-11 ENCOUNTER — Other Ambulatory Visit (HOSPITAL_COMMUNITY): Payer: Self-pay | Admitting: Surgery

## 2020-08-11 DIAGNOSIS — T8140XA Infection following a procedure, unspecified, initial encounter: Secondary | ICD-10-CM

## 2020-08-11 DIAGNOSIS — N611 Abscess of the breast and nipple: Secondary | ICD-10-CM | POA: Diagnosis not present

## 2020-08-11 DIAGNOSIS — L089 Local infection of the skin and subcutaneous tissue, unspecified: Secondary | ICD-10-CM | POA: Diagnosis not present

## 2020-08-11 LAB — BODY FLUID CULTURE

## 2020-08-11 MED ORDER — CEPHALEXIN 500 MG PO CAPS: 500.0000 mg | ORAL_CAPSULE | Freq: Four times a day (QID) | ORAL | 0 refills | Status: DC

## 2020-08-11 MED ORDER — POTASSIUM CHLORIDE CRYS ER 20 MEQ PO TBCR: 20.0000 meq | EXTENDED_RELEASE_TABLET | Freq: Once | ORAL | Status: DC

## 2020-08-11 MED ORDER — CIPROFLOXACIN HCL 500 MG PO TABS: 500.0000 mg | ORAL_TABLET | Freq: Two times a day (BID) | ORAL | 0 refills | Status: DC

## 2020-08-11 MED ORDER — POTASSIUM CHLORIDE CRYS ER 20 MEQ PO TBCR
40.0000 meq | EXTENDED_RELEASE_TABLET | Freq: Once | ORAL | Status: AC
  Administered 2020-08-11: 40 meq via ORAL
  Filled 2020-08-11: qty 2

## 2020-08-11 MED FILL — CEPHALEXIN 500 MG CAPS: 500 | 11 days supply | Qty: 44 | Fill #0

## 2020-08-11 NOTE — Progress Notes (Signed)
Provided discharge education/instructions, all questions and concerns addressed, Pt not in distress, meds from [facility] pharmacy delivered to room, discharged home with belongings accompanied by husband.

## 2020-08-11 NOTE — Progress Notes (Signed)
Dressing change done at this time. No drainage to catheter site noted.  Redness around catheter site noted.Will continue to monitor.

## 2020-08-11 NOTE — Progress Notes (Signed)
[facility]  Date of Admission:  08/07/2020           Reason for visit: Follow up on wound infection  Current antibiotics: Vancomycin   ASSESSMENT:    #Right mastectomy wound infection and abscess secondary to MSSA status post drain placement 08/09/2020 #History of penicillin allergy and other antibiotic intolerance #Breast cancer   PLAN:    --Transition to oral cephalexin and recommend 14 days total for postoperative infection with abscess from drain placement.  End of therapy 08/23/2020 --Discussed at length with patient today regarding her antibiotic intolerances.  She does have a history of diarrhea noted to Keflex, however, she is agreeable to this medication to treat her current infection --Drain follow-up per IR --Will arrange follow-up in ID clinic prior to her antibiotics ending  MEDICATIONS:    Scheduled Meds: . acetaminophen  1,000 mg Oral Q6H  . amLODipine  5 mg Oral Daily  . enoxaparin (LOVENOX) injection  40 mg Subcutaneous Q24H  . ibuprofen  400 mg Oral TID  . irbesartan  300 mg Oral Daily  . potassium chloride  20 mEq Oral Once  . potassium chloride  40 mEq Oral Once  . simvastatin  20 mg Oral q1800  . sodium chloride flush  3 mL Intravenous Q12H  . sodium chloride flush  5 mL Intracatheter Q8H    Continuous Infusions: . vancomycin Stopped (08/11/20 0744)    PRN Meds: ALPRAZolam, ondansetron **OR** ondansetron (ZOFRAN) IV, sodium chloride flush, traMADol, white petrolatum, zolpidem   INTERVAL EVENTS:   No acute events noted overnight Afebrile Cultures with MSSA  SUBJECTIVE:   Patient reports no further fevers.  She reports the redness is improved.  She is anxious to learn about susceptibilities from her staph aureus infection and hopefully discharge home.  Review of Systems  Constitutional: Negative for chills and fever.  Respiratory: Negative.   Cardiovascular: Negative.   Gastrointestinal: Negative.    Skin:       Improving erythema     OBJECTIVE:   Allergies  Allergen Reactions  . Cephalexin Diarrhea  . Hydrochlorothiazide     Other reaction(s): hyponatremia  . Lisinopril Cough  . Potassium Chloride Other (See Comments)    Stomach irritation  . Sulfamethoxazole-Trimethoprim Other (See Comments)    Stomach irritated  . Tape Itching  . Clindamycin Hives  . Penicillin G Rash and Other (See Comments)    Has patient had a PCN reaction causing immediate rash, facial/tongue/throat swelling, SOB or lightheadedness with hypotension: Yes Has patient had a PCN reaction causing severe rash involving mucus membranes or skin necrosis: No Has patient had a PCN reaction that required hospitalization: No Has patient had a PCN reaction occurring within the last 10 years: No If all of the above answers are "NO", then may proceed with Cephalosporin use.     Blood pressure (!) 149/74, pulse 86, temperature 98.7 F (37.1 C), temperature source Oral, resp. rate 17, height 5\' 8"  (1.727 m), weight 79.4 kg, SpO2 96 %. Body mass index is 26.61 kg/m.  Physical Exam Constitutional:      General: She is not in acute distress.    Appearance: Normal appearance.  HENT:     Head: Normocephalic and atraumatic.  Skin:    Comments: Right chest wall erythema noted.  Not tender and no fluctuance.  Right-sided drain and left-sided drain in place.  Neurological:     Mental Status: She is alert.     Lab  Results & Microbiology Lab Results  Component Value Date   WBC 8.8 08/08/2020   HGB 11.9 (L) 08/08/2020   HCT 35.5 (L) 08/08/2020   MCV 92.4 08/08/2020   PLT 234 08/08/2020    Lab Results  Component Value Date   NA 141 08/10/2020   K 3.2 (L) 08/10/2020   CO2 23 08/10/2020   GLUCOSE 106 (H) 08/10/2020   BUN 8 08/10/2020   CREATININE 0.60 08/10/2020   CALCIUM 8.5 (L) 08/10/2020   GFRNONAA >60 08/10/2020   GFRAA >60 04/16/2019    Lab Results  Component Value Date   ALT 102 (H) 08/07/2020    AST 55 (H) 08/07/2020   ALKPHOS 179 (H) 08/07/2020   BILITOT 0.4 08/07/2020     I have reviewed the micro and lab results in Epic.  Imaging [place] IMAGE GUIDED DRAINAGE BY PERCUTANEOUS CATHETER  Result Date: 08/09/2020 INDICATION: 70 year old female with a history of breast cancer status post simple bilateral mastectomies. Her right mastectomy incision is complicated by extensive cellulitis and a complex fluid collection concerning for abscess. She presents for drain placement. EXAM: Ultrasound-guided drain placement MEDICATIONS: The patient is currently admitted to the hospital and receiving intravenous antibiotics. The antibiotics were administered within an appropriate time frame prior to the initiation of the procedure. ANESTHESIA/SEDATION: None. COMPLICATIONS: None immediate. PROCEDURE: Informed written consent was obtained from the patient after a thorough discussion of the procedural risks, benefits and alternatives. All questions were addressed. Maximal Sterile Barrier Technique was utilized including caps, mask, sterile gowns, sterile gloves, sterile drape, hand hygiene and skin antiseptic. A timeout was performed prior to the initiation of the procedure. Ultrasound was used to interrogate the incision. A large mildly complex fluid collection can be seen in the subcutaneous soft tissues running longitudinal E with the incision. The overlying skin was sterilely prepped and draped in the standard fashion using chlorhexidine skin prep. Local anesthesia was attained by infiltration with 1% lidocaine. A small dermatotomy was made. Under real-time ultrasound guidance, a 17 gauge introducer needle was advanced into the fluid collection. A 0.035 wire was then advanced into the fluid collection. The needle was removed. The skin tract was dilated to 12 [place]. A Cook 12 [place] all-purpose drainage catheter was advanced over the wire and formed. Aspiration yields 25 mL purulent appearing fluid. Samples were  sent for Gram stain and culture. The catheter was gently flushed before being connected to JP bulb suction. The catheter was secured to the skin with 0 Prolene suture. A sterile bandage was applied. IMPRESSION: Successful placement of a 12 French drainage catheter. Aspiration yields 25 mL purulent fluid. A sample was sent for culture. PLAN: 1. Maintain drain to JP bulb suction. 2. Flush drainage catheter at least once per shift. 3. Once cellulitis and clinical picture are improved and drain output is minimal (less than 10 mL daily) the drainage catheter may be removed. Electronically Signed   By: Malachy Moan M.D.   On: 08/09/2020 19:19       [facility] [phone] pager 08/11/2020, 11:18 AM   I spent greater than 35 minutes with the patient including greater than 50% of time in face to face counsel of the patient and in coordination of their care.

## 2020-08-11 NOTE — Discharge Summary (Signed)
Physician Discharge Summary  Patient ID: Haley [name] MRN: [id number] DOB/AGE: Nov 01, 1950 70 y.o.  Admit date: 08/07/2020 Discharge date: 08/11/2020  Admission Diagnoses:Active Problems:   Wound infection   Breast abscess  Discharge Diagnoses:  Active Problems:   Wound infection   Breast abscess   Discharged Condition: good  Hospital Course: Patient met in 2 weeks after bilateral simple mastectomy.  She developed a right-sided cellulitis.  She is admitted for IV antibiotics and IV fluids.  On hospital day 2 she underwent a drain placement to the right flap.  Her fever resolved.  White count normalized.  The erythema began to improve while on vancomycin and meropenem.  Cultures revealed this to be Staph aureus pansensitive.  She was placed on Cipro 500 mg p.o. twice daily.  She was discharged home on hospital day #4.   Consults: ID  Significant Diagnostic Studies: labs:  CBC    Component Value Date/Time   WBC 8.8 08/08/2020 0426   RBC 3.84 (L) 08/08/2020 0426   HGB 11.9 (L) 08/08/2020 0426   HCT 35.5 (L) 08/08/2020 0426   PLT 234 08/08/2020 0426   MCV 92.4 08/08/2020 0426   MCH 31.0 08/08/2020 0426   MCHC 33.5 08/08/2020 0426   RDW 13.4 08/08/2020 0426   LYMPHSABS 0.6 (L) 08/07/2020 1236   MONOABS 1.0 08/07/2020 1236   EOSABS 0.3 08/07/2020 1236   BASOSABS 0.0 08/07/2020 1236    Treatments: IV hydration, antibiotics: vancomycin and drain placement per IR   Discharge Exam: Blood pressure (!) 149/74, pulse 86, temperature 98.7 F (37.1 C), temperature source Oral, resp. rate 17, height _0  (1.727 m), weight 79.4 kg, SpO2 96 %. General appearance: alert and cooperative Resp: clear to auscultation bilaterally Cardio: regular rate and rhythm, S1, S2 normal, no murmur, click, rub or gallop Incision/Wound: Erythema to right mastectomy wound is improved.  JP drain is serous.  Left mastectomy wound is clean dry and intact.  There is no evidence of crepitance or  fluctuance.  Disposition: Discharge disposition: 01-Home or Self Care       Discharge Instructions    Diet - low sodium heart healthy   Complete by: As directed    Increase activity slowly   Complete by: As directed      Allergies as of 08/11/2020      Reactions   Cephalexin Diarrhea   Hydrochlorothiazide    Other reaction(s): hyponatremia   Lisinopril Cough   Potassium Chloride Other (See Comments)   Stomach irritation   Sulfamethoxazole-trimethoprim Other (See Comments)   Stomach irritated   Tape Itching   Clindamycin Hives   Penicillin G Rash, Other (See Comments)   Has patient had a PCN reaction causing immediate rash, facial/tongue/throat swelling, SOB or lightheadedness with hypotension: Yes Has patient had a PCN reaction causing severe rash involving mucus membranes or skin necrosis: No Has patient had a PCN reaction that required hospitalization: No Has patient had a PCN reaction occurring within the last 10 years: No If all of the above answers are "NO", then may proceed with Cephalosporin use.      Medication List    STOP taking these medications   ibuprofen 800 MG tablet Commonly known as: ADVIL   oxyCODONE 5 MG immediate release tablet Commonly known as: Oxy IR/ROXICODONE     TAKE these medications   ALPRAZolam 0.5 MG tablet Commonly known as: XANAX Take 0.5 mg by mouth as needed for anxiety.   amLODipine 5 MG tablet Commonly known as: NORVASC  Take 5 mg by mouth daily.   ciprofloxacin 500 MG tablet Commonly known as: Cipro Take 1 tablet (500 mg total) by mouth 2 (two) times daily.   Eszopiclone 3 MG Tabs Take 3 mg by mouth at bedtime as needed (sleep).   loratadine 10 MG tablet Commonly known as: CLARITIN Take 10 mg by mouth daily.   simvastatin 20 MG tablet Commonly known as: ZOCOR Take 20 mg by mouth at bedtime.   triamcinolone 0.1 % Commonly known as: KENALOG Apply 1 application topically as needed (itching).   valsartan 320 MG  tablet Commonly known as: DIOVAN Take 320 mg by mouth daily.   Vitamin D (Ergocalciferol) 1.25 MG (50000 UNIT) Caps capsule Commonly known as: DRISDOL Take 50,000 capsules by mouth every Sunday.        Signed: Turner Daniels MD  08/11/2020, 2:14 PM

## 2020-08-11 NOTE — Progress Notes (Signed)
2 Days Post-Op   Subjective/Chief Complaint: Pt feel ok No fever overnight  Culture sensitivities pending  On vancomycin    Objective: Vital signs in last 24 hours: Temp:  [97.8 F (36.6 C)-98.7 F (37.1 C)] 98.7 F (37.1 C) (01/04 0719) Pulse Rate:  [80-86] 86 (01/04 0719) Resp:  [16-17] 17 (01/04 0719) BP: (125-152)/(62-77) 149/74 (01/04 0719) SpO2:  [96 %-98 %] 96 % (01/04 0719) Last BM Date: 08/06/20  Intake/Output from previous day: 01/03 0701 - 01/04 0700 In: 655.9 [I.V.:241; IV Piggyback:399.9] Out: 53 [Drains:53] Intake/Output this shift: Total I/O In: 3 [I.V.:3] Out: -   Incision/Wound:right mastectomy erythema is less today Drains serous  Wound not warn to touch Left side is healing well  Lab Results:  No results for input(s): WBC, HGB, HCT, PLT in the last 72 hours. BMET Recent Labs    08/10/20 0437  NA 141  K 3.2*  CL 105  CO2 23  GLUCOSE 106*  BUN 8  CREATININE 0.60  CALCIUM 8.5*   PT/INR No results for input(s): LABPROT, INR in the last 72 hours. ABG No results for input(s): PHART, HCO3 in the last 72 hours.  Invalid input(s): PCO2, PO2  Studies/Results: [place] IMAGE GUIDED DRAINAGE BY PERCUTANEOUS CATHETER  Result Date: 08/09/2020 INDICATION: 70 year old female with a history of breast cancer status post simple bilateral mastectomies. Her right mastectomy incision is complicated by extensive cellulitis and a complex fluid collection concerning for abscess. She presents for drain placement. EXAM: Ultrasound-guided drain placement MEDICATIONS: The patient is currently admitted to the hospital and receiving intravenous antibiotics. The antibiotics were administered within an appropriate time frame prior to the initiation of the procedure. ANESTHESIA/SEDATION: None. COMPLICATIONS: None immediate. PROCEDURE: Informed written consent was obtained from the patient after a thorough discussion of the procedural risks, benefits and alternatives. All  questions were addressed. Maximal Sterile Barrier Technique was utilized including caps, mask, sterile gowns, sterile gloves, sterile drape, hand hygiene and skin antiseptic. A timeout was performed prior to the initiation of the procedure. Ultrasound was used to interrogate the incision. A large mildly complex fluid collection can be seen in the subcutaneous soft tissues running longitudinal E with the incision. The overlying skin was sterilely prepped and draped in the standard fashion using chlorhexidine skin prep. Local anesthesia was attained by infiltration with 1% lidocaine. A small dermatotomy was made. Under real-time ultrasound guidance, a 17 gauge introducer needle was advanced into the fluid collection. A 0.035 wire was then advanced into the fluid collection. The needle was removed. The skin tract was dilated to 12 [place]. A Cook 12 [place] all-purpose drainage catheter was advanced over the wire and formed. Aspiration yields 25 mL purulent appearing fluid. Samples were sent for Gram stain and culture. The catheter was gently flushed before being connected to JP bulb suction. The catheter was secured to the skin with 0 Prolene suture. A sterile bandage was applied. IMPRESSION: Successful placement of a 12 French drainage catheter. Aspiration yields 25 mL purulent fluid. A sample was sent for culture. PLAN: 1. Maintain drain to JP bulb suction. 2. Flush drainage catheter at least once per shift. 3. Once cellulitis and clinical picture are improved and drain output is minimal (less than 10 mL daily) the drainage catheter may be removed. Electronically Signed   By: Malachy Moan M.D.   On: 08/09/2020 19:19    Anti-infectives: Anti-infectives (From admission, onward)   Start     Dose/Rate Route Frequency Ordered Stop   08/09/20 1730  vancomycin (VANCOREADY) IVPB 750 mg/150 mL        750 mg 150 mL/hr over 60 Minutes Intravenous Every 12 hours 08/09/20 1456     08/09/20 1400  meropenem (MERREM) 1  g in sodium chloride 0.9 % 100 mL IVPB  Status:  Discontinued        1 g 200 mL/hr over 30 Minutes Intravenous Every 8 hours 08/09/20 0944 08/10/20 1019   08/08/20 0200  vancomycin (VANCOREADY) IVPB 750 mg/150 mL  Status:  Discontinued        750 mg 150 mL/hr over 60 Minutes Intravenous Every 12 hours 08/08/20 0151 08/09/20 0942   08/08/20 0200  ceFEPIme (MAXIPIME) 2 g in sodium chloride 0.9 % 100 mL IVPB  Status:  Discontinued        2 g 200 mL/hr over 30 Minutes Intravenous Every 8 hours 08/08/20 0151 08/09/20 0942      Assessment/Plan:   S/P  B  mastectomy 3 weeks ago with S  aureus right wound  cellulitis s/p right sided drain placement   On vancomycin day 4   Improving  Waiting for sensitivities  D/C once sensitivities back to tailor ABX       LOS: 3 days    Marcello Moores A Jamieka Royle 08/11/2020

## 2020-08-11 NOTE — Plan of Care (Signed)

## 2020-08-11 NOTE — Discharge Instructions (Signed)
[facility] Surgical drains are used to remove extra fluid that normally builds up in a surgical wound after surgery. A surgical drain helps to heal a surgical wound. Different kinds of surgical drains include:  Active drains. These drains use suction to pull drainage away from the surgical wound. Drainage flows through a tube to a container outside of the body. With these drains, you need to keep the bulb or the drainage container flat (compressed) at all times, except while you empty it. Flattening the bulb or container creates suction.  Passive drains. These drains allow fluid to drain naturally, by gravity. Drainage flows through a tube to a bandage (dressing) or a container outside of the body. Passive drains do not need to be emptied. A drain is placed during surgery. Right after surgery, drainage is usually bright red and a little thicker than water. The drainage may gradually turn yellow or pink and become thinner. It is likely that your health care provider will remove the drain when the drainage stops or when the amount decreases to 1-2 Tbsp (15-30 mL) during a 24-hour period. Supplies needed:  Tape.  Germ-free cleaning solution (sterile saline).  Cotton swabs.  Split gauze drain sponge: 4 x 4 inches (10 x 10 cm).  Gauze square: 4 x 4 inches (10 x 10 cm). How to care for your surgical drain Care for your drain as told by your health care provider. This is important to help prevent infection. If your drain is placed at your back, or any other hard-to-reach area, ask another person to assist you in performing the following tasks: General care  Keep the skin around the drain dry and covered with a dressing at all times.  Check your drain area every day for signs of infection. Check for: ? Redness, swelling, or pain. ? Pus or a bad smell. ? Cloudy drainage. ? Tenderness or pressure at the drain exit site. Changing the dressing Follow instructions from your health care  provider about how to change your dressing. Change your dressing at least once a day. Change it more often if needed to keep the dressing dry. Make sure you: 1. Gather your supplies. 2. Wash your hands with soap and water before you change your dressing. If soap and water are not available, use hand sanitizer. 3. Remove the old dressing. Avoid using scissors to do that. 4. Wash your hands with soap and water again after removing the old dressing. 5. Use sterile saline to clean your skin around the drain. You may need to use a cotton swab to clean the skin. 6. Place the tube through the slit in a drain sponge. Place the drain sponge so that it covers your wound. 7. Place the gauze square or another drain sponge on top of the drain sponge that is on the wound. Make sure the tube is between those layers. 8. Tape the dressing to your skin. 9. Tape the drainage tube to your skin 1-2 inches (2.5-5 cm) below the place where the tube enters your body. Taping keeps the tube from pulling on any stitches (sutures) that you have. 10. Wash your hands with soap and water. 11. Write down the color of your drainage and how often you change your dressing. How to empty your active drain  1. Make sure that you have a measuring cup that you can empty your drainage into. 2. Wash your hands with soap and water. If soap and water are not available, use hand sanitizer. 3.  Loosen any pins or clips that hold the tube in place. 4. If your health care provider tells you to strip the tube to prevent clots and tube blockages: ? Hold the tube at the skin with one hand. Use your other hand to pinch the tubing with your thumb and first finger. ? Gently move your fingers down the tube while squeezing very lightly. This clears any drainage, clots, or tissue from the tube. ? You may need to do this several times each day to keep the tube clear. Do not pull on the tube. 5. Open the bulb cap or the drain plug. Do not touch the  inside of the cap or the bottom of the plug. 6. Turn the device upside down and gently squeeze. 7. Empty all of the drainage into the measuring cup. 8. Compress the bulb or the container and replace the cap or the plug. To compress the bulb or the container, squeeze it firmly in the middle while you close the cap or plug the container. 9. Write down the amount of drainage that you have in each 24-hour period. If you have less than 2 Tbsp (30 mL) of drainage during 24 hours, contact your health care provider. 10. Flush the drainage down the toilet. 11. Wash your hands with soap and water. Contact a health care provider if:  You have redness, swelling, or pain around your drain area.  You have pus or a bad smell coming from your drain area.  You have a fever or chills.  The skin around your drain is warm to the touch.  The amount of drainage that you have is increasing instead of decreasing.  You have drainage that is cloudy.  There is a sudden stop or a sudden decrease in the amount of drainage that you have.  Your drain tube falls out.  Your active drain does not stay compressed after you empty it. Summary  Surgical drains are used to remove extra fluid that normally builds up in a surgical wound after surgery.  Different kinds of surgical drains include active drains and passive drains. Active drains use suction to pull drainage away from the surgical wound, and passive drains allow fluid to drain naturally.  It is important to care for your drain to prevent infection. If your drain is placed at your back, or any other hard-to-reach area, ask another person to assist you.  Contact your health care provider if you have redness, swelling, or pain around your drain area. This information is not intended to replace advice given to you by your health care provider. Make sure you discuss any questions you have with your health care provider. Document Revised: 08/29/2018 Document  Reviewed: 08/29/2018 Elsevier Patient Education  [place].   Cephalexin (Keflex) Please take your Keflex 500mg  tablet four times daily to complete 14 days of therapy (through 08/22/2020).   What is this drug used for? .It is used to treat bacterial infections.  What are some things I need to know or do while I take this drug? .Tell all of your health care providers that you take this drug. This includes your doctors, nurses, pharmacists, and dentists. .Have your blood work checked if you are on this drug for a long time. Talk with your doctor. .If you have high blood sugar (diabetes) and test your urine glucose, talk with your doctor to find out which tests are best to use. .This drug may affect certain lab tests. Tell all of your health care  providers and lab workers that you take this drug. .Do not use longer than you have been told. A second infection may happen. .Tell your doctor if you are pregnant, plan on getting pregnant, or are breast-feeding. You will need to talk about the benefits and risks to you and the baby.  What are some side effects that I need to call my doctor about right away? WARNING/CAUTION: Even though it may be rare, some people may have very bad and sometimes deadly side effects when taking a drug. Tell your doctor or get medical help right away if you have any of the following signs or symptoms that may be related to a very bad side effect: .Signs of an allergic reaction, like rash; hives; itching; red, swollen, blistered, or peeling skin with or without fever; wheezing; tightness in the chest or throat; trouble breathing, swallowing, or talking; unusual hoarseness; or swelling of the mouth, face, lips, tongue, or throat. .Signs of liver problems like dark urine, feeling tired, not hungry, upset stomach or stomach pain, light-colored stools, throwing up, or yellow skin or eyes. .Fever, chills, or sore throat; any unexplained bruising or bleeding; or feeling  very tired or weak. .Feeling confused. .Hallucinations (seeing or hearing things that are not there). .Not able to pass urine or change in how much urine is passed. .Seizures. .Very bad dizziness. .Very bad headache. .Very bad joint pain. .Vaginal itching or discharge. .Diarrhea is common with antibiotics. Rarely, a severe form called C diff-associated diarrhea (CDAD) may happen. Sometimes, this has led to a deadly bowel problem. CDAD may happen during or a few months after taking antibiotics. Call your doctor right away if you have stomach pain, cramps, or very loose, watery, or bloody stools. Check with your doctor before treating diarrhea. .A severe skin reaction (Stevens-Johnson syndrome/toxic epidermal necrolysis) may happen. It can cause severe health problems that may not go away, and sometimes death. Get medical help right away if you have signs like red, swollen, blistered, or peeling skin (with or without fever); red or irritated eyes; or sores in your mouth, throat, nose, or eyes.  What are some other side effects of this drug? All drugs may cause side effects. However, many people have no side effects or only have minor side effects. Call your doctor or get medical help if any of these side effects or any other side effects bother you or do not go away: .Stomach pain or diarrhea. Marland KitchenUpset stomach or throwing up. These are not all of the side effects that may occur. If you have questions about side effects, call your doctor. Call your doctor for medical advice about side effects.  You may report side effects to your national health agency.  How is this drug best taken? Use this drug as ordered by your doctor. Read all information given to you. Follow all instructions closely.  All products: .Take with or without food. Take with food if it causes an upset stomach. .Keep taking this drug as you have been told by your doctor or other health care provider, even if you feel well.  What  do I do if I miss a dose? Marland KitchenTake a missed dose as soon as you think about it. .If it is close to the time for your next dose, skip the missed dose and go back to your normal time. .Do not take 2 doses at the same time or extra doses.  How do I store and/or throw out this drug? .Store at room temperature. .Store  in a dry place. Do not store in a bathroom. .Throw away unused or expired drugs. Do not flush down a toilet or pour down a drain unless you are told to do so. Check with your pharmacist if you have questions about the best way to throw out drugs. There may be drug take-back programs in your area.  This generalized information is a limited summary of diagnosis, treatment, and/or medication information. It is not meant to be comprehensive and should be used as a tool to help the user understand and/or assess potential diagnostic and treatment options. It does NOT include all information about conditions, treatments, medications, side effects, or risks that may apply to a specific patient. It is not intended to be medical advice or a substitute for the medical advice, diagnosis, or treatment of a health care provider based on the health care provider's examination and assessment of a patient's specific and unique circumstances. Patients must speak with a health care provider for complete information about their health, medical questions, and treatment options, including any risks or benefits regarding use of medications. This information does not endorse any treatments or medications as safe, effective, or approved for treating a specific patient. [employer] and its affiliates disclaim any warranty or liability relating to this information or the use thereof. The use of this information is governed by the Terms of Use, available at https://www.wolterskluwer.com/en/solutions/lexicomp/about/eula.

## 2020-08-13 LAB — CULTURE, BLOOD (ROUTINE X 2)
Culture: NO GROWTH
Culture: NO GROWTH
Special Requests: ADEQUATE
Special Requests: ADEQUATE

## 2020-08-14 LAB — AEROBIC/ANAEROBIC CULTURE W GRAM STAIN (SURGICAL/DEEP WOUND): Special Requests: NORMAL

## 2020-08-17 NOTE — Progress Notes (Signed)
Patient: Haley [name]  DOB: 1950-12-03 MRN: [id number] PCP: Harlan Stains, MD    Patient Active Problem List   Diagnosis Date Noted  . Counseled about COVID-19 virus  08/18/2020  . Breast abscess   . Wound infection 08/08/2020  . Greater trochanteric bursitis of right hip 05/23/2018  . Trochanteric bursitis, right hip 05/23/2018     Subjective:   Chief Complaint  Patient presents with  . Hospitalization Follow-up     HPI: Haley [name] is a 70 y.o. female seen in the hospital in early January 2022 for infection of surgical site of right mastectomy site that diagnosed 12/27 following mastectomy on 12/16. High temps to > 101 F after drain was removed on the right, then a few days later significant redness spread across her chest. Was started on vancomycin / cefepime in the hospital  >> noted to have more erythema and blisters with cefepime changed to meropenem due to concern for worsening after debridement was done in OR. Drain was placed. Grew out methicillin sensitive staphylococcus aureus. Switched to PO cephalexin QID and she is scheduled to complete this on 1/16.   Se has been doing very well since discharge. Actually yesterday was a great day for her regarding her recovery. She had enough energy to play cards with her husband and make it through the day without laying on the couch all day. She has done well adding florestor to her regimen to tolerate the antibiotic without any diarrhea.  Pleasantly surprised with the positive impact that has had. She is very worried about what to look for and if the infection could come back especially looking at possible drain removal today at her follow up with Dr. Donella Stade. Her husband has been recording drain output and < 10cc daily now, thin red tinged fluid.     Review of Systems  Constitutional: Negative for chills, fever and malaise/fatigue.  Respiratory: Negative for cough and shortness of breath.   Cardiovascular: Negative  for chest pain.  Musculoskeletal: Negative for myalgias.  Skin: Negative for itching and rash.  Neurological: Negative for dizziness.    Past Medical History:  Diagnosis Date  . Anxiety   . Basal cell carcinoma    removed from right arm   . Bursitis    right   . GERD (gastroesophageal reflux disease)   . Hypercholesteremia   . Hypertension   . PONV (postoperative nausea and vomiting)     Outpatient Medications Prior to Visit  Medication Sig Dispense Refill  . ALPRAZolam (XANAX) 0.5 MG tablet Take 0.5 mg by mouth as needed for anxiety.    Marland Kitchen amLODipine (NORVASC) 5 MG tablet Take 5 mg by mouth daily.     . Eszopiclone 3 MG TABS Take 3 mg by mouth at bedtime as needed (sleep).    . loratadine (CLARITIN) 10 MG tablet Take 10 mg by mouth daily.    . simvastatin (ZOCOR) 20 MG tablet Take 20 mg by mouth at bedtime.     . triamcinolone cream (KENALOG) 0.1 % Apply 1 application topically as needed (itching).    . valsartan (DIOVAN) 320 MG tablet Take 320 mg by mouth daily.    . Vitamin D, Ergocalciferol, (DRISDOL) 50000 units CAPS capsule Take 50,000 capsules by mouth every Sunday.   0  . cephALEXin (KEFLEX) 500 MG capsule Take 1 capsule (500 mg total) by mouth 4 (four) times daily for 11 days. 44 capsule 0   No facility-administered medications prior to visit.  Allergies  Allergen Reactions  . Cephalexin Diarrhea  . Hydrochlorothiazide     Other reaction(s): hyponatremia  . Lisinopril Cough  . Potassium Chloride Other (See Comments)    Stomach irritation  . Sulfamethoxazole-Trimethoprim Other (See Comments)    Stomach irritated  . Tape Itching  . Clindamycin Hives  . Penicillin G Rash and Other (See Comments)    Has patient had a PCN reaction causing immediate rash, facial/tongue/throat swelling, SOB or lightheadedness with hypotension: Yes Has patient had a PCN reaction causing severe rash involving mucus membranes or skin necrosis: No Has patient had a PCN reaction that  required hospitalization: No Has patient had a PCN reaction occurring within the last 10 years: No If all of the above answers are "NO", then may proceed with Cephalosporin use.     Social History   Tobacco Use  . Smoking status: Former Smoker    Quit date: 05/17/2009    Years since quitting: 11.2  . Smokeless tobacco: Former Systems developer    Types: Secondary school teacher  . Vaping Use: Never used  Substance Use Topics  . Alcohol use: Yes    Comment: occ  . Drug use: Never    No family history on file.  Objective:   Vitals:   08/18/20 0846  BP: (!) 156/93  Pulse: 85  Temp: (!) 97.4 F (36.3 C)  TempSrc: Oral  Weight: 178 lb (80.7 kg)   Body mass index is 27.06 kg/m.  Physical Exam Constitutional:      Appearance: Normal appearance. She is not ill-appearing.  HENT:     Mouth/Throat:     Mouth: Mucous membranes are moist.     Pharynx: Oropharynx is clear.  Eyes:     General: No scleral icterus. Cardiovascular:     Rate and Rhythm: Normal rate.  Pulmonary:     Effort: Pulmonary effort is normal.  Skin:    General: Skin is warm and dry.  Neurological:     Mental Status: She is alert and oriented to person, place, and time.  Psychiatric:        Mood and Affect: Mood normal.        Thought Content: Thought content normal.     Lab Results: Lab Results  Component Value Date   WBC 8.8 08/08/2020   HGB 11.9 (L) 08/08/2020   HCT 35.5 (L) 08/08/2020   MCV 92.4 08/08/2020   PLT 234 08/08/2020    Lab Results  Component Value Date   CREATININE 0.60 08/10/2020   BUN 8 08/10/2020   NA 141 08/10/2020   K 3.2 (L) 08/10/2020   CL 105 08/10/2020   CO2 23 08/10/2020    Lab Results  Component Value Date   ALT 102 (H) 08/07/2020   AST 55 (H) 08/07/2020   ALKPHOS 179 (H) 08/07/2020   BILITOT 0.4 08/07/2020     Assessment & Plan:   Problem List Items Addressed This Visit      Unprioritized   Counseled about COVID-19 virus     She has had vaccination and booster -  discussed the necessity of early detection and testing should she suspect she has symptoms related to covid. She would be a good antiviral candidate given her age and breast cancer history. I asked her to contact me should she need help with this.       Breast abscess - Primary    MSSA abscess involving R mastectomy surgical incision. We spent time reviewing her infection and plan for  treatment. I suspect she will have her drains removed today at the surgery office. She will then complete 4 more days of antibiotics as planned. She is very worried about having to go back to the hospital again if it comes back. Will send in 5d supply of cephalexin for her to have on hand incase she notices any relapsing signs of infection following drain removal. I asked her to send me a mychart should she resume her antibiotic and take pictures of the incision for me. She feels very comfortable with this plan.   Will plan another televisit follow up in 1 week.         I spent 25 minutes in discussion with her and her husband about the above points.   Janene Madeira, MSN, NP-C [facility] [place] Pager: [phone] Office: [phone]  08/18/20  12:20 PM

## 2020-08-18 ENCOUNTER — Encounter: Payer: Self-pay | Admitting: Infectious Diseases

## 2020-08-18 ENCOUNTER — Other Ambulatory Visit: Payer: Self-pay

## 2020-08-18 ENCOUNTER — Ambulatory Visit (INDEPENDENT_AMBULATORY_CARE_PROVIDER_SITE_OTHER): Payer: Medicare Other | Admitting: Infectious Diseases

## 2020-08-18 VITALS — BP 156/93 | HR 85 | Temp 97.4°F | Wt 178.0 lb

## 2020-08-18 DIAGNOSIS — N611 Abscess of the breast and nipple: Secondary | ICD-10-CM | POA: Diagnosis present

## 2020-08-18 DIAGNOSIS — Z7189 Other specified counseling: Secondary | ICD-10-CM | POA: Diagnosis not present

## 2020-08-18 MED ORDER — CEPHALEXIN 500 MG PO CAPS: 500.0000 mg | ORAL_CAPSULE | Freq: Four times a day (QID) | ORAL | 0 refills | Status: AC

## 2020-08-18 NOTE — Patient Instructions (Signed)
Will plan to stop as scheduled on 1/16 for you cephalexin.   Be on the look out for redness, swelling, pain, drainage or fevers after the drains come out and the antibiotics stop.   You have 5 days available to you at the pharmacy should you notice these changes - start taking them 4 times a day again and send me a message to let me know.   COVID Testing:  [email or web address].[email or web address]  Will plan a telephone follow up in 1 week to check in on your progress.

## 2020-08-18 NOTE — Assessment & Plan Note (Signed)
MSSA abscess involving R mastectomy surgical incision. We spent time reviewing her infection and plan for treatment. I suspect she will have her drains removed today at the surgery office. She will then complete 4 more days of antibiotics as planned. She is very worried about having to go back to the hospital again if it comes back. Will send in 5d supply of cephalexin for her to have on hand incase she notices any relapsing signs of infection following drain removal. I asked her to send me a mychart should she resume her antibiotic and take pictures of the incision for me. She feels very comfortable with this plan.   Will plan another televisit follow up in 1 week.

## 2020-08-18 NOTE — Assessment & Plan Note (Signed)
She has had vaccination and booster - discussed the necessity of early detection and testing should she suspect she has symptoms related to covid. She would be a good antiviral candidate given her age and breast cancer history. I asked her to contact me should she need help with this.

## 2020-08-25 ENCOUNTER — Telehealth (INDEPENDENT_AMBULATORY_CARE_PROVIDER_SITE_OTHER): Payer: Medicare Other | Admitting: Infectious Diseases

## 2020-08-25 ENCOUNTER — Encounter: Payer: Self-pay | Admitting: Infectious Diseases

## 2020-08-25 ENCOUNTER — Other Ambulatory Visit: Payer: Self-pay

## 2020-08-25 DIAGNOSIS — N611 Abscess of the breast and nipple: Secondary | ICD-10-CM | POA: Diagnosis not present

## 2020-08-25 NOTE — Progress Notes (Signed)
Patient: Haley [name]  DOB: 09/23/1950 MRN: [id number] PCP: Harlan Stains, MD    VIRTUAL CARE ENCOUNTER  I connected with Haley [name] on 08/25/20 at 10:30 AM EST by TELEPHONE and verified that I am speaking with the correct person using two identifiers.   I discussed the limitations, risks, security and privacy concerns of performing an evaluation and management service by telephone and the availability of in person appointments. I also discussed with the patient that there may be a patient responsible charge related to this service. The patient expressed understanding and agreed to proceed.  Patient Location: [facility]   Other Participants: patient's husband, Mining engineer Location: Provider Home, [place]      Subjective:   Chief Complaint  Patient presents with  . Follow-up    Stopped antibiotics for surgical site infection as scheduled.      HPI: Haley [name] is a 70 y.o. female seen in the hospital in early January 2022 for infection of surgical site of right mastectomy site that diagnosed 12/27 following mastectomy on 12/16. High temps to > 101 F after drain was removed on the right, then a few days later significant redness spread across her chest. Was started on vancomycin / cefepime in the hospital  >> noted to have more erythema and blisters with cefepime changed to meropenem due to concern for worsening after debridement was done in OR. Drain was placed. Grew out methicillin sensitive staphylococcus aureus. Switched to PO cephalexin QID and she is scheduled to complete this on 1/16.   Finished antibiotic on Saturday. Incisions per her husband look great. Skin is pealing very heavily for now as the swelling has healed. No fevers or chills. Drain is putting out very very little (5cc a day). Going back Friday to see him again in hopes to remove the final drain. Feels her energy is getting back to normal. She is playing cards every night with her husband.      Past Medical History:  Diagnosis Date  . Anxiety   . Basal cell carcinoma    removed from right arm   . Bursitis    right   . GERD (gastroesophageal reflux disease)   . Hypercholesteremia   . Hypertension   . PONV (postoperative nausea and vomiting)     Outpatient Medications Prior to Visit  Medication Sig Dispense Refill  . ALPRAZolam (XANAX) 0.5 MG tablet Take 0.5 mg by mouth as needed for anxiety.    Marland Kitchen amLODipine (NORVASC) 5 MG tablet Take 5 mg by mouth daily.     . Eszopiclone 3 MG TABS Take 3 mg by mouth at bedtime as needed (sleep).    . loratadine (CLARITIN) 10 MG tablet Take 10 mg by mouth daily.    . simvastatin (ZOCOR) 20 MG tablet Take 20 mg by mouth at bedtime.     . triamcinolone cream (KENALOG) 0.1 % Apply 1 application topically as needed (itching).    . valsartan (DIOVAN) 320 MG tablet Take 320 mg by mouth daily.    . Vitamin D, Ergocalciferol, (DRISDOL) 50000 units CAPS capsule Take 50,000 capsules by mouth every Sunday.   0   No facility-administered medications prior to visit.     Allergies  Allergen Reactions  . Cephalexin Diarrhea  . Hydrochlorothiazide     Other reaction(s): hyponatremia  . Lisinopril Cough  . Potassium Chloride Other (See Comments)    Stomach irritation  . Sulfamethoxazole-Trimethoprim Other (See Comments)    Stomach  irritated  . Tape Itching  . Clindamycin Hives  . Penicillin G Rash and Other (See Comments)    Has patient had a PCN reaction causing immediate rash, facial/tongue/throat swelling, SOB or lightheadedness with hypotension: Yes Has patient had a PCN reaction causing severe rash involving mucus membranes or skin necrosis: No Has patient had a PCN reaction that required hospitalization: No Has patient had a PCN reaction occurring within the last 10 years: No If all of the above answers are "NO", then may proceed with Cephalosporin use.     Social History   Tobacco Use  . Smoking status: Former Smoker    Quit  date: 05/17/2009    Years since quitting: 11.2  . Smokeless tobacco: Former Systems developer    Types: Secondary school teacher  . Vaping Use: Never used  Substance Use Topics  . Alcohol use: Yes    Comment: occ  . Drug use: Never    No family history on file.  Objective:   There were no vitals filed for this visit. There is no height or weight on file to calculate BMI.  Haley [name] sounds in good health on the phone today. No shortness of breath detected during conversation.    Lab Results: Lab Results  Component Value Date   WBC 8.8 08/08/2020   HGB 11.9 (L) 08/08/2020   HCT 35.5 (L) 08/08/2020   MCV 92.4 08/08/2020   PLT 234 08/08/2020    Lab Results  Component Value Date   CREATININE 0.60 08/10/2020   BUN 8 08/10/2020   NA 141 08/10/2020   K 3.2 (L) 08/10/2020   CL 105 08/10/2020   CO2 23 08/10/2020    Lab Results  Component Value Date   ALT 102 (H) 08/07/2020   AST 55 (H) 08/07/2020   ALKPHOS 179 (H) 08/07/2020   BILITOT 0.4 08/07/2020     Assessment & Plan:   Problem List Items Addressed This Visit      Unprioritized   Breast abscess    No fevers/chills. Drain out put remains < 5cc a day and cellulitis sounds to be resolved based on description. She will continue off antibiotics and follow with surgical team as planned.  As needed follow up with ID.         Follow Up Instructions: As above   I discussed the assessment and treatment plan with the patient. The patient was provided an opportunity to ask questions and all were answered. The patient agreed with the plan and demonstrated an understanding of the instructions.   The patient was advised to call back or seek an in-person evaluation if the symptoms worsen or if the condition fails to improve as anticipated.  I provided 13 minutes of non-face-to-face time during this encounter.   Janene Madeira, MSN, NP-C [facility] Infectious Disease [email or web address] Pager: [phone] Office: [place]: [phone]  08/25/20  10:34 AM

## 2020-08-25 NOTE — Assessment & Plan Note (Signed)
No fevers/chills. Drain out put remains < 5cc a day and cellulitis sounds to be resolved based on description. She will continue off antibiotics and follow with surgical team as planned.  As needed follow up with ID.

## 2020-09-17 ENCOUNTER — Other Ambulatory Visit: Payer: Self-pay

## 2020-09-17 ENCOUNTER — Ambulatory Visit: Payer: Medicare Other | Attending: Surgery

## 2020-09-17 DIAGNOSIS — C50911 Malignant neoplasm of unspecified site of right female breast: Secondary | ICD-10-CM | POA: Insufficient documentation

## 2020-09-17 DIAGNOSIS — R293 Abnormal posture: Secondary | ICD-10-CM | POA: Insufficient documentation

## 2020-09-17 DIAGNOSIS — C50912 Malignant neoplasm of unspecified site of left female breast: Secondary | ICD-10-CM | POA: Insufficient documentation

## 2020-09-17 DIAGNOSIS — M6281 Muscle weakness (generalized): Secondary | ICD-10-CM | POA: Insufficient documentation

## 2020-09-17 NOTE — Therapy (Signed)
[place], [place], [address] Phone: [phone]   Fax:  [phone]  Physical Therapy Evaluation  Patient Details  Name: Haley [name] MRN: [id number] Date of Birth: 02-Nov-1950 Referring Provider (PT): Dr. Brantley Stage   Encounter Date: 09/17/2020   PT End of Session - 09/17/20 1841    Visit Number 1    Number of Visits 9    Date for PT Re-Evaluation 10/15/20    PT Start Time 1455    PT Stop Time 1605    PT Time Calculation (min) 70 min    Activity Tolerance Patient tolerated treatment well    Behavior During Therapy [facility] for tasks assessed/performed           Past Medical History:  Diagnosis Date  . Anxiety   . Basal cell carcinoma    removed from right arm   . Bursitis    right   . GERD (gastroesophageal reflux disease)   . Hypercholesteremia   . Hypertension   . PONV (postoperative nausea and vomiting)     Past Surgical History:  Procedure Laterality Date  . BREAST EXCISIONAL BIOPSY Right 1970   benign  . BREAST EXCISIONAL BIOPSY Left 2009   benign LCIS  . BREAST LUMPECTOMY WITH RADIOACTIVE SEED LOCALIZATION Left 04/18/2019   Procedure: LEFT BREAST LUMPECTOMY WITH RADIOACTIVE SEED LOCALIZATION;  Surgeon: Erroll Luna, MD;  Location: [facility];  Service: General;  Laterality: Left;  . breast tumor   1970  . INCISIONAL BREAST BIOPSY Left   . MASTECTOMY W/ SENTINEL NODE BIOPSY Bilateral 07/23/2020   Procedure: BILATERAL SIMPLE MASTECTOMIES WITH LEFT SENTINEL LYMPH NODE MAPPING;  Surgeon: Erroll Luna, MD;  Location: [facility];  Service: General;  Laterality: Bilateral;  . OPEN SURGICAL REPAIR OF GLUTEAL TENDON Right 05/23/2018   Procedure: right hip bursectomy; gluteal tendon repair;  Surgeon: Gaynelle Arabian, MD;  Location: [facility];  Service: Orthopedics;  Laterality: Right;  15min  . rotator   05/2007   rigth rotator cuff repair     There were no  vitals filed for this visit.    Subjective Assessment - 09/17/20 1502    Subjective Pt is post bilateral Mastectomies on 07/23/20 with left SLNB. She has a past history of 2 other left breast surgeries;1  in 2009, and 1 in 2020.  She experienced cellulitis in chest on Jan 1 and was hospitalized for 4 days.  She has recovered from this.  She is here today for tightness in her chest.  When she wears her  binder it feels good. Feels really numb in right armpit and mildly in left armpit, and in her chest. She has purchased bras and prosthesis.  She does not notice any swelling.  She does not have to have radiation or chemo. She does get intermittent sharp pains but she is not concerned by this.  She did attend the ABC class with Val on Feb 7, but she did not want to start exercises until she came for her evaluation. She did start doing some 5 lb biceps curls and shoulder extension that MD told her was OK.  We discussed lowering the wt to 3 # and before starting others letting us progress her here.    Pertinent History Pt had a left  breast incisional biopsy in 2009, a left lumpectomy in 2020, and a bilateral mastectomy performed on 07/23/20. 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 She does not require radiation or chemotherapy. She has had a right RTC repair and a gluteal tendon repair in 2019.  She also has HBP    Patient Stated Goals to be assessed for progress at this point and need for therapy    Currently in Pain? No/denies              [facility] PT Assessment - 09/17/20 0001      Assessment   Medical Diagnosis Bilateral Mastectomies   3 LN removed on left, all -   Referring Provider (PT) Dr. Brantley Stage    Onset Date/Surgical Date 07/23/20    Hand Dominance Right      Precautions   Precautions --   lymphedema risk     Restrictions   Weight Bearing Restrictions No    Other  Position/Activity Restrictions no      Balance Screen   Has the patient fallen in the past 6 months No    Has the patient had a decrease in activity level because of a fear of falling?  No    Is the patient reluctant to leave their home because of a fear of falling?  No      Home Environment   Living Environment Private residence    Living Arrangements Spouse/significant other    Available Help at Discharge Family      Prior Function   Level of Independence Independent      Cognition   Overall Cognitive Status Within Functional Limits for tasks assessed      Observation/Other Assessments   Observations bilateral mastectomy incisions healing.  Left incision with glue still present,      Observation/Other Assessments-Edema    Edema --   bilateral incisions healed with left breast with remaining glue.  Bilateral dog ears     Posture/Postural Control   Posture/Postural Control Postural limitations    Postural Limitations Rounded Shoulders;Forward head      AROM   Right Shoulder Extension 73 Degrees    Right Shoulder Flexion 140 Degrees    Right Shoulder ABduction 158 Degrees    Right Shoulder Internal Rotation 75 Degrees    Right Shoulder External Rotation 87 Degrees    Left Shoulder Extension 70 Degrees    Left Shoulder Flexion 153 Degrees    Left Shoulder ABduction 157 Degrees    Left Shoulder Internal Rotation 76 Degrees    Left Shoulder External Rotation 85 Degrees             LYMPHEDEMA/ONCOLOGY QUESTIONNAIRE - 09/17/20 0001      Surgeries   Mastectomy Date 07/23/20    Lumpectomy Date 04/18/19   also had left incisional biopsy 2009 for LCIS   Number Lymph Nodes Removed 3      Treatment   Active Chemotherapy Treatment No    Past Chemotherapy Treatment No    Active Radiation Treatment No    Past Radiation Treatment No    Current Hormone Treatment No    Past Hormone Therapy No      What other symptoms do you have   Are you Having Heaviness or Tightness Yes     Are you having Pain --   intermittent shooting   Do you have infections Yes    Comments had cellulitis right chest and was hospitalized for 4 days      Right Upper Extremity Lymphedema   At Axilla  35 cm    15  cm Proximal to Olecranon Process 34.8 cm    10 cm Proximal to Olecranon Process 31.3 cm    Olecranon Process 26.5 cm    15 cm Proximal to Ulnar Styloid Process 23.8 cm    10 cm Proximal to Ulnar Styloid Process 21.3 cm    Just Proximal to Ulnar Styloid Process 16.1 cm    At Base of 2nd Digit 6.1 cm      Left Upper Extremity Lymphedema   At Axilla  34.8 cm    15 cm Proximal to Olecranon Process 33.5 cm    10 cm Proximal to Olecranon Process 30.8 cm    Olecranon Process 26.5 cm    15 cm Proximal to Ulnar Styloid Process 22.9 cm    10 cm Proximal to Ulnar Styloid Process 21 cm    Just Proximal to Ulnar Styloid Process 16.3 cm    At Base of 2nd Digit 5.8 cm                 Quick Dash - 09/17/20 0001    Open a tight or new jar Mild difficulty    Do heavy household chores (wash walls, wash floors) No difficulty    Carry a shopping bag or briefcase No difficulty    Wash your back No difficulty    Use a knife to cut food No difficulty    During the past week, to what extent has your arm, shoulder or hand problem interfered with your normal social activities with family, friends, neighbors, or groups? Not at all    During the past week, to what extent has your arm, shoulder or hand problem limited your work or other regular daily activities Not at all    Arm, shoulder, or hand pain. None    Tingling (pins and needles) in your arm, shoulder, or hand Mild    Difficulty Sleeping No difficulty    DASH Score 2.27 %            Objective measurements completed on examination: See above findings.               PT Education - 09/17/20 1840    Education Details Pt was educated in 4 post op exercises including supine AA shoulder flexion, Star gazer stretch, Wall  walk for abd, and scapular retraction. Answered pt questions about exercises she was doing at home and advised to decrease to 3# weight.   Person(s) Educated Patient    Methods Explanation;Demonstration;Handout    Comprehension Verbalized understanding;Returned demonstration            PT Short Term Goals - 09/17/20 1854      PT SHORT TERM GOAL #1   Title Pt will be independent with a HEP for ROM and strengthening of bilateral shoulders    Time 2    Period Weeks    Status New    Target Date 10/01/20             PT Long Term Goals - 09/17/20 1855      PT LONG TERM GOAL #1   Title Pt will report decreased chest tightness by atleast 50%    Time 4    Period Weeks    Status New    Target Date 10/15/20      PT LONG TERM GOAL #2   Title Pt will have bilateral shoulder flexion atleast 150 degrees and abduction atleast 165 degrees for optimal reaching ability for home activities    Time 4  Period Weeks    Status New    Target Date 10/15/20      PT LONG TERM GOAL #3   Title Pt will be educated in risk reduction practices for lymphedema    Time 4    Period Weeks    Status New    Target Date 10/15/20      PT LONG TERM GOAL #5   Title Pt will be educated in long term strengthening program and proper progression    Time 4    Period Weeks    Status New    Target Date 10/15/20                  Plan - 09/17/20 1843    Clinical Impression Statement Pt is s/p bilateral mastectomies on 07/23/20. She developed infection in the right chest for which she was hospitalized but which is now cleared up.  She presents with limitations in end ranges of ROM with main complaints of tightness, tightness in chest around incisions, decreased scar mobility,and mild chest edema.  She will benefit from therapy to improve ROM, decrease scar tissue, MFR to decrease tightness in chest, and therapeutic exercise for ROM and strengthening, and MLD prn.    Personal Factors and Comorbidities  Comorbidity 3+    Comorbidities 3 breast surgeries with lymphedema risk on left, HBP,RTC repair    Stability/Clinical Decision Making Stable/Uncomplicated    Clinical Decision Making Low    Rehab Potential Excellent    PT Frequency 2x / week    PT Duration 4 weeks    PT Treatment/Interventions ADLs/Self Care Home Management;Therapeutic activities;Therapeutic exercise;Neuromuscular re-education;Manual techniques;Patient/family education;Manual lymph drainage;Scar mobilization;Passive range of motion    PT Next Visit Plan review AAROM exs, MFR techniques axilla/chest, PROM, MLD prn for chest area.  Progress to theraband as able    PT Home Exercise Plan 4 post op exs    Consulted and Agree with Plan of Care Patient           Patient will benefit from skilled therapeutic intervention in order to improve the following deficits and impairments:  Decreased activity tolerance,Decreased knowledge of precautions,Decreased range of motion,Decreased skin integrity,Decreased scar mobility,Decreased strength,Increased edema,Impaired sensation,Postural dysfunction  Visit Diagnosis: Abnormal posture  Muscle weakness (generalized)  Bilateral malignant neoplasm of breast in female, unspecified estrogen receptor status, unspecified site of breast [facility])     Problem List Patient Active Problem List   Diagnosis Date Noted  . Counseled about COVID-19 virus  08/18/2020  . Breast abscess   . Wound infection 08/08/2020  . Greater trochanteric bursitis of right hip 05/23/2018  . Trochanteric bursitis, right hip 05/23/2018    Claris Pong 09/17/2020, 7:02 PM  [place], [place], [address] Phone: [phone]   Fax:  [phone]  Name: Haley [name] MRN: [id number] Date of Birth: April 18, 1951 Cheral Almas, PT 09/17/20 7:04 PM

## 2020-09-17 NOTE — Patient Instructions (Signed)
Patient was instructed today in a home exercise program today for post op shoulder range of motion. These included active assist shoulder flexion in supine with clasped hands or wand,scapular retraction, wall walking with shoulder abduction, and hands behind head external rotation in supine.Marland Kitchen  She was encouraged to do these twice a day, holding 3-5 seconds and repeating 5 times to restore shoulder ROM

## 2020-09-21 ENCOUNTER — Ambulatory Visit: Payer: Medicare Other

## 2020-09-21 ENCOUNTER — Other Ambulatory Visit: Payer: Self-pay

## 2020-09-21 DIAGNOSIS — R293 Abnormal posture: Secondary | ICD-10-CM | POA: Diagnosis not present

## 2020-09-21 DIAGNOSIS — C50912 Malignant neoplasm of unspecified site of left female breast: Secondary | ICD-10-CM | POA: Diagnosis not present

## 2020-09-21 DIAGNOSIS — C50911 Malignant neoplasm of unspecified site of right female breast: Secondary | ICD-10-CM

## 2020-09-21 DIAGNOSIS — M6281 Muscle weakness (generalized): Secondary | ICD-10-CM | POA: Diagnosis not present

## 2020-09-21 NOTE — Therapy (Signed)
[place], [place], [address] Phone: [phone]   Fax:  [phone]  Physical Therapy Treatment  Patient Details  Name: Haley [name] MRN: [id number] Date of Birth: 12/13/1950 Referring Provider (PT): Dr. Brantley Stage   Encounter Date: 09/21/2020   PT End of Session - 09/21/20 1540    Visit Number 2    Number of Visits 9    Date for PT Re-Evaluation 10/15/20    PT Start Time 9702    PT Stop Time 1408    PT Time Calculation (min) 65 min    Activity Tolerance Patient tolerated treatment well    Behavior During Therapy [facility] for tasks assessed/performed           Past Medical History:  Diagnosis Date  . Anxiety   . Basal cell carcinoma    removed from right arm   . Bursitis    right   . GERD (gastroesophageal reflux disease)   . Hypercholesteremia   . Hypertension   . PONV (postoperative nausea and vomiting)     Past Surgical History:  Procedure Laterality Date  . BREAST EXCISIONAL BIOPSY Right 1970   benign  . BREAST EXCISIONAL BIOPSY Left 2009   benign LCIS  . BREAST LUMPECTOMY WITH RADIOACTIVE SEED LOCALIZATION Left 04/18/2019   Procedure: LEFT BREAST LUMPECTOMY WITH RADIOACTIVE SEED LOCALIZATION;  Surgeon: Erroll Luna, MD;  Location: [place];  Service: General;  Laterality: Left;  . breast tumor   1970  . INCISIONAL BREAST BIOPSY Left   . MASTECTOMY W/ SENTINEL NODE BIOPSY Bilateral 07/23/2020   Procedure: BILATERAL SIMPLE MASTECTOMIES WITH LEFT SENTINEL LYMPH NODE MAPPING;  Surgeon: Erroll Luna, MD;  Location: [place];  Service: General;  Laterality: Bilateral;  . OPEN SURGICAL REPAIR OF GLUTEAL TENDON Right 05/23/2018   Procedure: right hip bursectomy; gluteal tendon repair;  Surgeon: Gaynelle Arabian, MD;  [facility];  Service: Orthopedics;  Laterality: Right;  57min  . rotator   05/2007   rigth rotator cuff repair     There were no  vitals filed for this visit.   Subjective Assessment - 09/21/20 1326    Subjective I've been doing the exercises she gave me last time and they are going well.    Pertinent History Pt had a left  breast incisional biopsy in 2009, a left lumpectomy in 2020, and a bilateral mastectomy performed on 07/23/20. 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 She does not require radiation or chemotherapy. She has had a right RTC repair and a gluteal tendon repair in 2019.  She also has HBP    Patient Stated Goals to be assessed for progress at this point and need for therapy    Currently in Pain? No/denies                             [facility] Adult PT Treatment/Exercise - 09/21/20 0001      Self-Care   Self-Care Other Self-Care Comments    Other Self-Care Comments  Spent beginning of session answering pts questions regarding normal healing for her incisions and that's it's okay and in fact good to feel a stretch into her incision at this point in her recovery. Also advised her to try to begin getnly scrubbing her incisions when showering to help slough off remaining  glue, some of which came off during session just with MFR. Pt verbalized understanding all.      Shoulder Exercises: Pulleys   Flexion 2 minutes    Flexion Limitations Pt returned therapist demo    ABduction 2 minutes    ABduction Limitations Pt returned therapist demo and advised pt okay to feel stretch and in fact need to be pushing arm until stretch felt so as to make progress.      Shoulder Exercises: Stretch   Corner Stretch 5 reps;20 seconds   in doorway at varying heights   Wall Stretch - Flexion Limitations Demonstrated this for pt in doorway while she was on pulleys to encourage her to begin working this into her da, multiple times a day, along with abduction      Manual Therapy   Manual Therapy Myofascial  release;Passive ROM    Myofascial Release In Supine to Lt axilla and along pectoralis insertion to incision where tightness palpable. Also along Lt mastectomy incision    Passive ROM In Supine to Lt shoulder into flexion, abduction and D2 to pts tolerance                    PT Short Term Goals - 09/17/20 1854      PT SHORT TERM GOAL #1   Title Pt will be independent with a HEP for ROM and strengthening of bilateral shoulders    Time 2    Period Weeks    Status New    Target Date 10/01/20             PT Long Term Goals - 09/17/20 1855      PT LONG TERM GOAL #1   Title Pt will report decreased chest tightness by atleast 50%    Time 4    Period Weeks    Status New    Target Date 10/15/20      PT LONG TERM GOAL #2   Title Pt will have bilateral shoulder flexion atleast 150 degrees and abduction atleast 165 degrees for optimal reaching ability for home activities    Time 4    Period Weeks    Status New    Target Date 10/15/20      PT LONG TERM GOAL #3   Title Pt will be educated in risk reduction practices for lymphedema    Time 4    Period Weeks    Status New    Target Date 10/15/20      PT LONG TERM GOAL #5   Title Pt will be educated in long term strengthening program and proper progression    Time 4    Period Weeks    Status New    Target Date 10/15/20                 Plan - 09/21/20 1541    Clinical Impression Statement Focused on manual therapy and stretching for pt Lt upper quadrant today. Also spent alot of time at beginning of session answering pts questions about normal healing time her for incision and how that now that she is past the 6 week mark its okay for her to begin stretching more aggressively into her available end motions for bil shoulders. Also educated and encouraged her to begin scar tissue mobs/MFR over well healing mastectomy incisions. Pt verbalizes understanding of all today and reports feeling better about her current  functional status after being informed of what's "normal' healing. Pt also still wearing her velcro  binder so encouraged her to try wearing her new bras and bring them to next session so we can assess fit of these.    Personal Factors and Comorbidities Comorbidity 3+    Comorbidities 3 breast surgeries with lymphedema risk on left, HBP,RTC repair    Stability/Clinical Decision Making Stable/Uncomplicated    Rehab Potential Excellent    PT Frequency 2x / week    PT Duration 4 weeks    PT Treatment/Interventions ADLs/Self Care Home Management;Therapeutic activities;Therapeutic exercise;Neuromuscular re-education;Manual techniques;Patient/family education;Manual lymph drainage;Scar mobilization;Passive range of motion    PT Next Visit Plan review AAROM exs, MFR techniques bil axillae/chest, PROM, MLD prn for chest area.  Progress to theraband as able    PT Home Exercise Plan 4 post op exs; doorway stretching    Consulted and Agree with Plan of Care Patient           Patient will benefit from skilled therapeutic intervention in order to improve the following deficits and impairments:  Decreased activity tolerance,Decreased knowledge of precautions,Decreased range of motion,Decreased skin integrity,Decreased scar mobility,Decreased strength,Increased edema,Impaired sensation,Postural dysfunction  Visit Diagnosis: Abnormal posture  Muscle weakness (generalized)  Bilateral malignant neoplasm of breast in female, unspecified estrogen receptor status, unspecified site of breast [facility])     Problem List Patient Active Problem List   Diagnosis Date Noted  . Counseled about COVID-19 virus  08/18/2020  . Breast abscess   . Wound infection 08/08/2020  . Greater trochanteric bursitis of right hip 05/23/2018  . Trochanteric bursitis, right hip 05/23/2018    Otelia Limes, PTA 09/21/2020, 3:49 PM  [place], [place], [address] Phone: [phone]   Fax:  [phone]  Name: Haley [name] MRN: [id number] Date of Birth: [birth year]

## 2020-09-24 ENCOUNTER — Other Ambulatory Visit: Payer: Self-pay

## 2020-09-24 ENCOUNTER — Ambulatory Visit: Payer: Medicare Other

## 2020-09-24 DIAGNOSIS — M6281 Muscle weakness (generalized): Secondary | ICD-10-CM | POA: Diagnosis not present

## 2020-09-24 DIAGNOSIS — C50911 Malignant neoplasm of unspecified site of right female breast: Secondary | ICD-10-CM

## 2020-09-24 DIAGNOSIS — R293 Abnormal posture: Secondary | ICD-10-CM

## 2020-09-24 DIAGNOSIS — C50912 Malignant neoplasm of unspecified site of left female breast: Secondary | ICD-10-CM | POA: Diagnosis not present

## 2020-09-24 NOTE — Therapy (Signed)
[place], [place], [address] Phone: [phone]   Fax:  [phone]  Physical Therapy Treatment  Patient Details  Name: Haley [name] MRN: [id number] Date of Birth: 1951-05-26 Referring Provider (PT): Dr. Brantley Stage   Encounter Date: 09/24/2020   PT End of Session - 09/24/20 1358    Visit Number 3    Number of Visits 9    Date for PT Re-Evaluation 10/15/20    PT Start Time 7017    PT Stop Time 1405    PT Time Calculation (min) 62 min    Activity Tolerance Patient tolerated treatment well    Behavior During Therapy [facility] for tasks assessed/performed           Past Medical History:  Diagnosis Date  . Anxiety   . Basal cell carcinoma    removed from right arm   . Bursitis    right   . GERD (gastroesophageal reflux disease)   . Hypercholesteremia   . Hypertension   . PONV (postoperative nausea and vomiting)     Past Surgical History:  Procedure Laterality Date  . BREAST EXCISIONAL BIOPSY Right 1970   benign  . BREAST EXCISIONAL BIOPSY Left 2009   benign LCIS  . BREAST LUMPECTOMY WITH RADIOACTIVE SEED LOCALIZATION Left 04/18/2019   Procedure: LEFT BREAST LUMPECTOMY WITH RADIOACTIVE SEED LOCALIZATION;  Surgeon: Erroll Luna, MD;  Location: [place];  Service: General;  Laterality: Left;  . breast tumor   1970  . INCISIONAL BREAST BIOPSY Left   . MASTECTOMY W/ SENTINEL NODE BIOPSY Bilateral 07/23/2020   Procedure: BILATERAL SIMPLE MASTECTOMIES WITH LEFT SENTINEL LYMPH NODE MAPPING;  Surgeon: Erroll Luna, MD;  Location: [place];  Service: General;  Laterality: Bilateral;  . OPEN SURGICAL REPAIR OF GLUTEAL TENDON Right 05/23/2018   Procedure: right hip bursectomy; gluteal tendon repair;  Surgeon: Gaynelle Arabian, MD;  [facility];  Service: Orthopedics;  Laterality: Right;  27min  . rotator   05/2007   rigth rotator cuff repair     There were no  vitals filed for this visit.   Subjective Assessment - 09/24/20 1301    Subjective Felt good after last visit.  Feel like I am loosened up some.  If I dont have compression or my bra on  I can feel the scars all the way around and it creeps me out, The feeling seems to be coming back in the back of my right arm. I'm not ready to look yet.   The left side occasionally has a pinging pain.    Pertinent History Pt had a left  breast incisional biopsy in 2009, a left lumpectomy in 2020, and a bilateral mastectomy performed on 07/23/20. 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 She does not require radiation or chemotherapy. She has had a right RTC repair and a gluteal tendon repair in 2019.  She also has HBP    Currently in Pain? No/denies                             [facility] Adult PT Treatment/Exercise - 09/24/20 0001      Shoulder Exercises: Supine   Other Supine Exercises supine flex, scaption horizontal abd x 5    Other Supine Exercises supine flex and scaption with wand x 5  Shoulder Exercises: Pulleys   Flexion 2 minutes    Flexion Limitations Pt returned therapist demo    ABduction 2 minutes      Manual Therapy   Manual Therapy Myofascial release;Passive ROM    Myofascial Release In Supine to bilateral axilla and along cording and pectoralis insertion to incision where tightness palpable. Also along bilateral mastectomy incision    Passive ROM In Supine to bilateral shoulder into flexion, abduction and D2 to pts tolerance                    PT Short Term Goals - 09/17/20 1854      PT SHORT TERM GOAL #1   Title Pt will be independent with a HEP for ROM and strengthening of bilateral shoulders    Time 2    Period Weeks    Status New    Target Date 10/01/20             PT Long Term Goals - 09/17/20 1855      PT LONG TERM GOAL #1   Title  Pt will report decreased chest tightness by atleast 50%    Time 4    Period Weeks    Status New    Target Date 10/15/20      PT LONG TERM GOAL #2   Title Pt will have bilateral shoulder flexion atleast 150 degrees and abduction atleast 165 degrees for optimal reaching ability for home activities    Time 4    Period Weeks    Status New    Target Date 10/15/20      PT LONG TERM GOAL #3   Title Pt will be educated in risk reduction practices for lymphedema    Time 4    Period Weeks    Status New    Target Date 10/15/20      PT LONG TERM GOAL #5   Title Pt will be educated in long term strengthening program and proper progression    Time 4    Period Weeks    Status New    Target Date 10/15/20                 Plan - 09/24/20 1514    Clinical Impression Statement Continued therapy for bilateral MFR to chest and axillary regions today and areas of cording.  Pt with large amount of cording on left that extends axilla to mastectomy incsion and to mid upper arm.  Despite not having LN's removed on the right she has a very tight cord in the right axillary/upper arm region also.  PROM was performed to both shoulders and we reviewed supine flexion and scaption with wand.  We also reviewed use of pulleys for flex, scaption, abd with emphasis on keeping shoulders down.  She has an over the door home traction unit that her husband is going to try and modify for her to use as pulley, but was also advised that she can purchase on [address] for about $8.00.  she performed AROM shoulder flex, scaption and horizontal abd in supine and should be able to progress to supine scapular series theraband next visit.    Personal Factors and Comorbidities Comorbidity 3+    Stability/Clinical Decision Making Stable/Uncomplicated    Clinical Decision Making Low    Rehab Potential Excellent    PT Frequency 2x / week    PT Duration 4 weeks    PT Treatment/Interventions ADLs/Self Care Home  Management;Therapeutic activities;Therapeutic exercise;Neuromuscular re-education;Manual techniques;Patient/family  education;Manual lymph drainage;Scar mobilization;Passive range of motion    PT Next Visit Plan review AAROM exs, MFR techniques bil axillae/chest, PROM, MLD prn for chest area.  Progress to supine scapular theraband next    PT Home Exercise Plan 4 post op exs; doorway stretching, pulleys    Consulted and Agree with Plan of Care Patient           Patient will benefit from skilled therapeutic intervention in order to improve the following deficits and impairments:  Decreased activity tolerance,Decreased knowledge of precautions,Decreased range of motion,Decreased skin integrity,Decreased scar mobility,Decreased strength,Increased edema,Impaired sensation,Postural dysfunction  Visit Diagnosis: Abnormal posture  Muscle weakness (generalized)  Bilateral malignant neoplasm of breast in female, unspecified estrogen receptor status, unspecified site of breast [facility])     Problem List Patient Active Problem List   Diagnosis Date Noted  . Counseled about COVID-19 virus  08/18/2020  . Breast abscess   . Wound infection 08/08/2020  . Greater trochanteric bursitis of right hip 05/23/2018  . Trochanteric bursitis, right hip 05/23/2018    Claris Pong 09/24/2020, 3:25 PM  [place], [place], [address] Phone: [phone]   Fax:  [phone]  Name: Haley [name] MRN: [id number] Date of Birth: Jan 29, 1951  Cheral Almas, PT 09/24/20 3:25 PM

## 2020-09-29 ENCOUNTER — Other Ambulatory Visit: Payer: Self-pay

## 2020-09-29 ENCOUNTER — Ambulatory Visit: Payer: Medicare Other

## 2020-09-29 DIAGNOSIS — C50911 Malignant neoplasm of unspecified site of right female breast: Secondary | ICD-10-CM

## 2020-09-29 DIAGNOSIS — M6281 Muscle weakness (generalized): Secondary | ICD-10-CM

## 2020-09-29 NOTE — Therapy (Signed)
[place], [place], [address] Phone: [phone]   Fax:  [phone]  Physical Therapy Treatment  Patient Details  Name: Haley [name] MRN: [id number] Date of Birth: 16-Jan-1951 Referring Provider (PT): Dr. Brantley Stage   Encounter Date: 09/29/2020   PT End of Session - 09/29/20 1615    Visit Number 4    Number of Visits 9    Date for PT Re-Evaluation 10/15/20    PT Start Time 1302    PT Stop Time 1401    PT Time Calculation (min) 59 min    Activity Tolerance Patient tolerated treatment well    Behavior During Therapy [facility] for tasks assessed/performed           Past Medical History:  Diagnosis Date  . Anxiety   . Basal cell carcinoma    removed from right arm   . Bursitis    right   . GERD (gastroesophageal reflux disease)   . Hypercholesteremia   . Hypertension   . PONV (postoperative nausea and vomiting)     Past Surgical History:  Procedure Laterality Date  . BREAST EXCISIONAL BIOPSY Right 1970   benign  . BREAST EXCISIONAL BIOPSY Left 2009   benign LCIS  . BREAST LUMPECTOMY WITH RADIOACTIVE SEED LOCALIZATION Left 04/18/2019   Procedure: LEFT BREAST LUMPECTOMY WITH RADIOACTIVE SEED LOCALIZATION;  Surgeon: Erroll Luna, MD;  Location: [place];  Service: General;  Laterality: Left;  . breast tumor   1970  . INCISIONAL BREAST BIOPSY Left   . MASTECTOMY W/ SENTINEL NODE BIOPSY Bilateral 07/23/2020   Procedure: BILATERAL SIMPLE MASTECTOMIES WITH LEFT SENTINEL LYMPH NODE MAPPING;  Surgeon: Erroll Luna, MD;  Location: [place];  Service: General;  Laterality: Bilateral;  . OPEN SURGICAL REPAIR OF GLUTEAL TENDON Right 05/23/2018   Procedure: right hip bursectomy; gluteal tendon repair;  Surgeon: Gaynelle Arabian, MD;  [facility];  Service: Orthopedics;  Laterality: Right;  65min  . rotator   05/2007   rigth rotator cuff repair     There were no  vitals filed for this visit.   Subjective Assessment - 09/29/20 1257    Subjective Got my new prosthesis today and they feel good so far.  I think I am getting some sensation back on my chest too.  I want you to check the area where I had the drain tube on the right.  Sometimes it burns in that area. Still feel like I have a fishing line where my incisions are. No pain.  Shoulder ROM seems improved today. Pt is compliant with HEP. My husband rigged up my pulleys at home. Can you show me again how to use them.   Pertinent History Pt had a left  breast incisional biopsy in 2009, a left lumpectomy in 2020, and a bilateral mastectomy performed on 07/23/20. 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 She does not require radiation or chemotherapy. She has had a right RTC repair and a gluteal tendon repair in 2019.  She also has HBP    Patient Stated Goals to be assessed for progress at this point and need for therapy                             [facility] Adult PT Treatment/Exercise - 09/29/20 0001  Shoulder Exercises: Standing   Retraction Strengthening;Both;10 reps    Theraband Level (Shoulder Retraction) Level 1 (Yellow)      Shoulder Exercises: Pulleys   Flexion 2 minutes    Scaption 2 minutes    ABduction 2 minutes      Manual Therapy   Manual Therapy Myofascial release;Passive ROM    Myofascial Release In Supine to bilateral axilla and along cording and pectoralis insertion to incision where tightness palpable. Also along bilateral mastectomy incision    Passive ROM In Supine to bilateral shoulder into flexion, abduction and D2 to pts tolerance                  PT Education - 09/29/20 1612    Education Details Pt was educated in use of pulleys for shoulder flex, scaption, abduction so she uses properly at home    Person(s) Educated Patient    Methods  Explanation;Demonstration    Comprehension Verbalized understanding;Returned demonstration            PT Short Term Goals - 09/17/20 1854      PT SHORT TERM GOAL #1   Title Pt will be independent with a HEP for ROM and strengthening of bilateral shoulders    Time 2    Period Weeks    Status New    Target Date 10/01/20             PT Long Term Goals - 09/17/20 1855      PT LONG TERM GOAL #1   Title Pt will report decreased chest tightness by atleast 50%    Time 4    Period Weeks    Status New    Target Date 10/15/20      PT LONG TERM GOAL #2   Title Pt will have bilateral shoulder flexion atleast 150 degrees and abduction atleast 165 degrees for optimal reaching ability for home activities    Time 4    Period Weeks    Status New    Target Date 10/15/20      PT LONG TERM GOAL #3   Title Pt will be educated in risk reduction practices for lymphedema    Time 4    Period Weeks    Status New    Target Date 10/15/20      PT LONG TERM GOAL #5   Title Pt will be educated in long term strengthening program and proper progression    Time 4    Period Weeks    Status New    Target Date 10/15/20                 Plan - 09/29/20 1616    Clinical Impression Statement therapy consisted of bilateral MFR to chest and axillary regions today and to bilateral areas of cording.  Pt continues with significant cording on the left that extends to her mastectomy incision and to mid upper arm.  Cording is also present on the right despite no LN removed per pt.  She has been advised to gently wash incisions with washcloth to remove excess glue.  She requested to be shown proper way to use pulleys again because she wasn't sure she was doing correctly. We tried standing scapular retraction with yellow theraband but needs review before being added to HEP    Personal Factors and Comorbidities Comorbidity 3+    Comorbidities 3 breast surgeries with lymphedema risk on left, HBP,RTC repair     Stability/Clinical Decision Making Stable/Uncomplicated  Rehab Potential Excellent    PT Frequency 2x / week    PT Duration 4 weeks    PT Treatment/Interventions ADLs/Self Care Home Management;Therapeutic activities;Therapeutic exercise;Neuromuscular re-education;Manual techniques;Patient/family education;Manual lymph drainage;Scar mobilization;Passive range of motion    PT Next Visit Plan review AAROM exs, MFR techniques bil axillae/chest areas of cording,PROM, MLD prn for chest area.  Progress to supine scapular theraband next, standing retraction    PT Home Exercise Plan 4 post op exs; doorway stretching, pulleys    Consulted and Agree with Plan of Care Patient           Patient will benefit from skilled therapeutic intervention in order to improve the following deficits and impairments:  Decreased activity tolerance,Decreased knowledge of precautions,Decreased range of motion,Decreased skin integrity,Decreased scar mobility,Decreased strength,Increased edema,Impaired sensation,Postural dysfunction,Increased fascial restricitons,Impaired UE functional use  Visit Diagnosis: Muscle weakness (generalized)  Bilateral malignant neoplasm of breast in female, unspecified estrogen receptor status, unspecified site of breast [facility])     Problem List Patient Active Problem List   Diagnosis Date Noted  . Counseled about COVID-19 virus  08/18/2020  . Breast abscess   . Wound infection 08/08/2020  . Greater trochanteric bursitis of right hip 05/23/2018  . Trochanteric bursitis, right hip 05/23/2018    [facility] 09/29/2020, 4:24 PM  [place], [place], [address] Phone: [phone]   Fax:  [phone]  Name: Haley [name] MRN: [id number] Date of Birth: [birth year]  Cheral Almas, PT 09/29/20 4:26 PM

## 2020-10-02 ENCOUNTER — Other Ambulatory Visit: Payer: Self-pay

## 2020-10-02 ENCOUNTER — Ambulatory Visit: Payer: Medicare Other

## 2020-10-02 DIAGNOSIS — R293 Abnormal posture: Secondary | ICD-10-CM

## 2020-10-02 DIAGNOSIS — C50912 Malignant neoplasm of unspecified site of left female breast: Secondary | ICD-10-CM | POA: Diagnosis not present

## 2020-10-02 DIAGNOSIS — C50911 Malignant neoplasm of unspecified site of right female breast: Secondary | ICD-10-CM

## 2020-10-02 DIAGNOSIS — M6281 Muscle weakness (generalized): Secondary | ICD-10-CM

## 2020-10-02 NOTE — Therapy (Signed)
[place], [place], [address] Phone: [phone]   Fax:  [phone]  Physical Therapy Treatment  Patient Details  Name: Haley [name] MRN: [id number] Date of Birth: 1951-04-09 Referring Provider (PT): Dr. Brantley Stage   Encounter Date: 10/02/2020   PT End of Session - 10/02/20 1151    Visit Number 5    Number of Visits 9    Date for PT Re-Evaluation 10/15/20    PT Start Time 1102    PT Stop Time 1158    PT Time Calculation (min) 56 min    Activity Tolerance Patient tolerated treatment well    Behavior During Therapy [facility] for tasks assessed/performed           Past Medical History:  Diagnosis Date  . Anxiety   . Basal cell carcinoma    removed from right arm   . Bursitis    right   . GERD (gastroesophageal reflux disease)   . Hypercholesteremia   . Hypertension   . PONV (postoperative nausea and vomiting)     Past Surgical History:  Procedure Laterality Date  . BREAST EXCISIONAL BIOPSY Right 1970   benign  . BREAST EXCISIONAL BIOPSY Left 2009   benign LCIS  . BREAST LUMPECTOMY WITH RADIOACTIVE SEED LOCALIZATION Left 04/18/2019   Procedure: LEFT BREAST LUMPECTOMY WITH RADIOACTIVE SEED LOCALIZATION;  Surgeon: Erroll Luna, MD;  Location: [place];  Service: General;  Laterality: Left;  . breast tumor   1970  . INCISIONAL BREAST BIOPSY Left   . MASTECTOMY W/ SENTINEL NODE BIOPSY Bilateral 07/23/2020   Procedure: BILATERAL SIMPLE MASTECTOMIES WITH LEFT SENTINEL LYMPH NODE MAPPING;  Surgeon: Erroll Luna, MD;  Location: [place];  Service: General;  Laterality: Bilateral;  . OPEN SURGICAL REPAIR OF GLUTEAL TENDON Right 05/23/2018   Procedure: right hip bursectomy; gluteal tendon repair;  Surgeon: Gaynelle Arabian, MD;  Location: [facility];  Service: Orthopedics;  Laterality: Right;  44min  . rotator   05/2007   rigth rotator cuff repair     There were no  vitals filed for this visit.   Subjective Assessment - 10/02/20 1105    Subjective Starting to get used to the prostheses now. Able to wear bra all day yesterday so it feels better and it takes my mind off the tightness. Still feel tight across chest.  Some of the glue is coming off now. no present pain.  Using pulleys at home and that is going well. Can reach a little better now. Numbness is getting better    Pertinent History Pt had a left  breast incisional biopsy in 2009, a left lumpectomy in 2020, and a bilateral mastectomy performed on 07/23/20. 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 She does not require radiation or chemotherapy. She has had a right RTC repair and a gluteal tendon repair in 2019.  She also has HBP    Patient Stated Goals to be assessed for progress at this point and need for therapy    Currently in Pain? No/denies                             [facility] Adult PT Treatment/Exercise - 10/02/20 0001      Shoulder Exercises: Supine   Horizontal ABduction Strengthening;Both;10 reps    Theraband Level (Shoulder Horizontal ABduction)  Level 1 (Yellow)    External Rotation Strengthening;Both;10 reps    Theraband Level (Shoulder External Rotation) Level 1 (Yellow)    Flexion Strengthening;Both;10 reps    Theraband Level (Shoulder Flexion) Level 1 (Yellow)    Diagonals Strengthening;Right;Left;10 reps    Theraband Level (Shoulder Diagonals) Level 1 (Yellow)      Shoulder Exercises: Stretch   Other Shoulder Stretches Doorway stretch x 3      Manual Therapy   Manual Therapy Myofascial release;Passive ROM    Myofascial Release In Supine to bilateral axilla and along cording and pectoralis insertion to incision where tightness palpable. Also along bilateral mastectomy incision    Passive ROM In Supine to bilateral shoulder into flexion, abduction and D2 to  pts tolerance                  PT Education - 10/02/20 1150    Education Details Pt. was educated in supine scapular series and given theraband for home use    Person(s) Educated Patient    Methods Explanation;Demonstration;Handout    Comprehension Verbalized understanding;Returned demonstration            PT Short Term Goals - 09/17/20 1854      PT SHORT TERM GOAL #1   Title Pt will be independent with a HEP for ROM and strengthening of bilateral shoulders    Time 2    Period Weeks    Status New    Target Date 10/01/20             PT Long Term Goals - 09/17/20 1855      PT LONG TERM GOAL #1   Title Pt will report decreased chest tightness by atleast 50%    Time 4    Period Weeks    Status New    Target Date 10/15/20      PT LONG TERM GOAL #2   Title Pt will have bilateral shoulder flexion atleast 150 degrees and abduction atleast 165 degrees for optimal reaching ability for home activities    Time 4    Period Weeks    Status New    Target Date 10/15/20      PT LONG TERM GOAL #3   Title Pt will be educated in risk reduction practices for lymphedema    Time 4    Period Weeks    Status New    Target Date 10/15/20      PT LONG TERM GOAL #5   Title Pt will be educated in long term strengthening program and proper progression    Time 4    Period Weeks    Status New    Target Date 10/15/20                 Plan - 10/02/20 1223    Clinical Impression Statement 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  Has a lower axillary cord very prominenet bilaterally.  Pts shoulder ROM is greatly improved.  Still complaining of tightness around her incisions and will benefit from more MFR.  She did well with supine scapular exs and illustrations were provided. She is going to bring her husband next visit to learn cording techniques    Personal Factors and Comorbidities  Comorbidity 3+    Stability/Clinical Decision Making Stable/Uncomplicated    Rehab Potential Excellent    PT Frequency 2x / week    PT Duration 4 weeks  PT Treatment/Interventions ADLs/Self Care Home Management;Therapeutic activities;Therapeutic exercise;Neuromuscular re-education;Manual techniques;Patient/family education;Manual lymph drainage;Scar mobilization;Passive range of motion    PT Next Visit Plan Instruct husband in cording release techniques, MFR techniques bil axillae/chest areas of cording,PROM, MLD prn for chest area.  Review supine scapular theraband  prn, standing retraction    PT Home Exercise Plan 4 post op exs; doorway stretching, pulleys    Consulted and Agree with Plan of Care Patient           Patient will benefit from skilled therapeutic intervention in order to improve the following deficits and impairments:  Decreased activity tolerance,Decreased knowledge of precautions,Decreased range of motion,Decreased skin integrity,Decreased scar mobility,Decreased strength,Increased edema,Impaired sensation,Postural dysfunction,Increased fascial restricitons,Impaired UE functional use  Visit Diagnosis: Muscle weakness (generalized)  Bilateral malignant neoplasm of breast in female, unspecified estrogen receptor status, unspecified site of breast ([place])  Abnormal posture     Problem List Patient Active Problem List   Diagnosis Date Noted  . Counseled about COVID-19 virus  08/18/2020  . Breast abscess   . Wound infection 08/08/2020  . Greater trochanteric bursitis of right hip 05/23/2018  . Trochanteric bursitis, right hip 05/23/2018    [facility] 10/02/2020, 12:28 PM  [place], [place], [address] Phone: [phone]   Fax:  [phone]  Name: Haley [name] MRN: [id number] Date of Birth: 09/30/1950  Cheral Almas, PT 10/02/20 12:29 PM

## 2020-10-02 NOTE — Patient Instructions (Addendum)
Over Head Pull: Narrow and Wide Grip   Cancer Rehab [phone]   On back, knees bent, feet flat, band across thighs, elbows straight but relaxed. Pull hands apart (start). Keeping elbows straight, bring arms up and over head, hands toward floor. Keep pull steady on band. Hold momentarily. Return slowly, keeping pull steady, back to start. Then do same with a wider grip on the band (past shoulder width) Repeat _5-10__ times. Band color __yellow____   Side Pull: Double Arm   On back, knees bent, feet flat. Arms perpendicular to body, shoulder level, elbows straight but relaxed. Pull arms out to sides, elbows straight. Resistance band comes across collarbones, hands toward floor. Hold momentarily. Slowly return to starting position. Repeat _5-10__ times. Band color _yellow____   Sword   On back, knees bent, feet flat, left hand on left hip, right hand above left. Pull right arm DIAGONALLY (hip to shoulder) across chest. Bring right arm along head toward floor. Hold momentarily. Slowly return to starting position. Repeat _5-10__ times. Do with left arm. Band color _yellow_____   Shoulder Rotation: Double Arm   On back, knees bent, feet flat, elbows tucked at sides, bent 90, hands palms up. Pull hands apart and down toward floor, keeping elbows near sides. Hold momentarily. Slowly return to starting position. Repeat _5-10__ times. Band color __yellow____   Axillary web syndrome (also called cording) can happen after having breast cancer surgery when lymph nodes in the armpit are removed. It presents as if you have a thin cord in your arm and can run from the armpit all the way down into the forearm. If you've had a sentinel node biopsy, the risk is 1-20% and if you've had an axillary lymph node dissection (more than 7 nodes removed), the risk is 36-72%. The ranges vary depending on the research study.  It most often happens 3-4 weeks post-op but can happen sooner or later. There are several  possibilities for what cording actually is. Although no one knows for sure as of yet, it may be related to lymphatics, veins, or other tissue. Sometimes cording resolves on its own but other times it requires physical therapy with a therapist who specializes in lymphedema and/or cancer rehab. Treatment typically involves stretching, manual techniques, and exercise. Sometimes cords get "released" while stretching or during manual treatment and the patient may experience the sensation of a "pop." This may feel strange but it is not dangerous and is a sign that the cord has released; range of motion may be improved in the process.

## 2020-10-06 ENCOUNTER — Other Ambulatory Visit: Payer: Self-pay

## 2020-10-06 ENCOUNTER — Ambulatory Visit: Payer: Medicare Other | Attending: Surgery

## 2020-10-06 DIAGNOSIS — C50911 Malignant neoplasm of unspecified site of right female breast: Secondary | ICD-10-CM | POA: Insufficient documentation

## 2020-10-06 DIAGNOSIS — C50912 Malignant neoplasm of unspecified site of left female breast: Secondary | ICD-10-CM | POA: Diagnosis not present

## 2020-10-06 DIAGNOSIS — R293 Abnormal posture: Secondary | ICD-10-CM | POA: Insufficient documentation

## 2020-10-06 DIAGNOSIS — E785 Hyperlipidemia, unspecified: Secondary | ICD-10-CM | POA: Diagnosis not present

## 2020-10-06 DIAGNOSIS — F419 Anxiety disorder, unspecified: Secondary | ICD-10-CM | POA: Diagnosis not present

## 2020-10-06 DIAGNOSIS — I10 Essential (primary) hypertension: Secondary | ICD-10-CM | POA: Diagnosis not present

## 2020-10-06 DIAGNOSIS — G4700 Insomnia, unspecified: Secondary | ICD-10-CM | POA: Diagnosis not present

## 2020-10-06 DIAGNOSIS — M6281 Muscle weakness (generalized): Secondary | ICD-10-CM | POA: Diagnosis not present

## 2020-10-06 NOTE — Therapy (Signed)
[place], [place], [address] Phone: [phone]   Fax:  [phone]  Physical Therapy Treatment  Patient Details  Name: Haley [name] MRN: [id number] Date of Birth: [birth year] Referring Provider (PT): Dr. Brantley Stage   Encounter Date: 10/06/2020   PT End of Session - 10/06/20 1409    Visit Number 6    Number of Visits 9    Date for PT Re-Evaluation 10/15/20    PT Start Time 1302    PT Stop Time 1400    PT Time Calculation (min) 58 min    Activity Tolerance Patient tolerated treatment well    Behavior During Therapy [facility] for tasks assessed/performed           Past Medical History:  Diagnosis Date  . Anxiety   . Basal cell carcinoma    removed from right arm   . Bursitis    right   . GERD (gastroesophageal reflux disease)   . Hypercholesteremia   . Hypertension   . PONV (postoperative nausea and vomiting)     Past Surgical History:  Procedure Laterality Date  . BREAST EXCISIONAL BIOPSY Right 1970   benign  . BREAST EXCISIONAL BIOPSY Left 2009   benign LCIS  . BREAST LUMPECTOMY WITH RADIOACTIVE SEED LOCALIZATION Left 04/18/2019   Procedure: LEFT BREAST LUMPECTOMY WITH RADIOACTIVE SEED LOCALIZATION;  Surgeon: Erroll Luna, MD;  Location: [place];  Service: General;  Laterality: Left;  . breast tumor   1970  . INCISIONAL BREAST BIOPSY Left   . MASTECTOMY W/ SENTINEL NODE BIOPSY Bilateral 07/23/2020   Procedure: BILATERAL SIMPLE MASTECTOMIES WITH LEFT SENTINEL LYMPH NODE MAPPING;  Surgeon: Erroll Luna, MD;  Location: [place];  Service: General;  Laterality: Bilateral;  . OPEN SURGICAL REPAIR OF GLUTEAL TENDON Right 05/23/2018   Procedure: right hip bursectomy; gluteal tendon repair;  Surgeon: Gaynelle Arabian, MD;  [facility];  Service: Orthopedics;  Laterality: Right;  81min  . rotator   05/2007   rigth rotator cuff repair     There were no  vitals filed for this visit.   Subjective Assessment - 10/06/20 1259    Subjective Pt reports the sword exercise I was getting some left shoulder pain so I think I am doing something wrong. Shoulders are doing fine today.   Compliant with pulleys and I can feel a really good stretch. I brought my husband to learn how to do the cording    Pertinent History Pt had a left  breast incisional biopsy in 2009, a left lumpectomy in 2020, and a bilateral mastectomy performed on 07/23/20. 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 She does not require radiation or chemotherapy. She has had a right RTC repair and a gluteal tendon repair in 2019.  She also has HBP    Patient Stated Goals to be assessed for progress at this point and need for therapy    Currently in Pain? No/denies                             [facility] Adult PT Treatment/Exercise - 10/06/20 0001      Shoulder Exercises: Supine   Horizontal ABduction Strengthening;Both;10 reps    Theraband Level (Shoulder Horizontal ABduction) Level 1 (Yellow)    External Rotation Strengthening;Both;10 reps    Theraband Level (  Shoulder External Rotation) Level 1 (Yellow)    Flexion Strengthening;Both;10 reps    Theraband Level (Shoulder Flexion) Level 1 (Yellow)    Diagonals Strengthening;Right;Left;10 reps    Theraband Level (Shoulder Diagonals) Level 1 (Yellow)      Shoulder Exercises: Therapy Ball   Flexion Both;10 reps    ABduction Right;Left;10 reps      Manual Therapy   Manual Therapy Myofascial release;Passive ROM    Manual therapy comments Pts. husband instructed in techniques to isolate cords and perform MFR with cords and to bilateral chest region.    Myofascial Release In Supine to bilateral axilla and along cording and pectoralis insertion to incision where tightness palpable. Also along bilateral mastectomy incision     Passive ROM In Supine to bilateral shoulder into flexion, abduction and D2 to pts tolerance                  PT Education - 10/06/20 1407    Education Details REview supine scapular areas to check form and discuss Left shoulder pain.  Pt was being to exuberant and trying to touch the floor, and also may not have been "in the groove" for diagonals.  Modified for pt to do with thumb up and not going so low to floor.  Pts husband was educated in techniques to alleviate cording and MFR techniques to chest area.  He did very well and required min. VC's    Person(s) Educated Patient;Spouse    Methods Explanation;Demonstration;Verbal cues    Comprehension Verbalized understanding;Returned demonstration            PT Short Term Goals - 09/17/20 1854      PT SHORT TERM GOAL #1   Title Pt will be independent with a HEP for ROM and strengthening of bilateral shoulders    Time 2    Period Weeks    Status New    Target Date 10/01/20             PT Long Term Goals - 09/17/20 1855      PT LONG TERM GOAL #1   Title Pt will report decreased chest tightness by atleast 50%    Time 4    Period Weeks    Status New    Target Date 10/15/20      PT LONG TERM GOAL #2   Title Pt will have bilateral shoulder flexion atleast 150 degrees and abduction atleast 165 degrees for optimal reaching ability for home activities    Time 4    Period Weeks    Status New    Target Date 10/15/20      PT LONG TERM GOAL #3   Title Pt will be educated in risk reduction practices for lymphedema    Time 4    Period Weeks    Status New    Target Date 10/15/20      PT LONG TERM GOAL #5   Title Pt will be educated in long term strengthening program and proper progression    Time 4    Period Weeks    Status New    Target Date 10/15/20                 Plan - 10/06/20 1411    Clinical Impression Statement Pts cording is much less prominent today bilaterally.  Pts husband was instructed how  to perform release techniques for cording, and MFR techniques to loosen fascia at chest.  He did exceptionally well with only occasional VC's  required.  PROM is progressing nicely.  We review supine scapular series again and reiterated she should never have pain in shoulder.  She required VC's for diagonals to stay "in the groove" and she is to perform with thumb up only.  She was also trying to do in too large a ROM and to touch the floor. She was advised to discontinue any exs that causes shoulder pain and educated that we are strengthening not stretching and she needs to do in smaller ROM.    Personal Factors and Comorbidities Comorbidity 3+    Comorbidities 3 breast surgeries with lymphedema risk on left, HBP,RTC repair    Stability/Clinical Decision Making Stable/Uncomplicated    Rehab Potential Excellent    PT Frequency 2x / week    PT Duration 4 weeks    PT Treatment/Interventions ADLs/Self Care Home Management;Therapeutic activities;Therapeutic exercise;Neuromuscular re-education;Manual techniques;Patient/family education;Manual lymph drainage;Scar mobilization;Passive range of motion    PT Next Visit Plan review release of cords with husband prn, Continue MFR techniques bilateral chest/area of cording, PROM, add standing retraction/extension    PT Home Exercise Plan 4 post op exs; doorway stretching, pulleys    Consulted and Agree with Plan of Care Patient           Patient will benefit from skilled therapeutic intervention in order to improve the following deficits and impairments:  Decreased activity tolerance,Decreased knowledge of precautions,Decreased range of motion,Decreased skin integrity,Decreased scar mobility,Decreased strength,Increased edema,Impaired sensation,Postural dysfunction,Increased fascial restricitons,Impaired UE functional use  Visit Diagnosis: Muscle weakness (generalized)  Bilateral malignant neoplasm of breast in female, unspecified estrogen receptor status,  unspecified site of breast ([place])  Abnormal posture     Problem List Patient Active Problem List   Diagnosis Date Noted  . Counseled about COVID-19 virus  08/18/2020  . Breast abscess   . Wound infection 08/08/2020  . Greater trochanteric bursitis of right hip 05/23/2018  . Trochanteric bursitis, right hip 05/23/2018    Claris Pong 10/06/2020, 2:21 PM  [place], [place], [address] Phone: [phone]   Fax:  [phone]  Name: Haley [name] MRN: [id number] Date of Birth: October 08, 1950  Cheral Almas, PT 10/06/20 2:22 PM

## 2020-10-08 ENCOUNTER — Ambulatory Visit: Payer: Medicare Other

## 2020-10-08 ENCOUNTER — Other Ambulatory Visit: Payer: Self-pay

## 2020-10-08 DIAGNOSIS — C50911 Malignant neoplasm of unspecified site of right female breast: Secondary | ICD-10-CM | POA: Diagnosis not present

## 2020-10-08 DIAGNOSIS — R293 Abnormal posture: Secondary | ICD-10-CM | POA: Diagnosis not present

## 2020-10-08 DIAGNOSIS — C50912 Malignant neoplasm of unspecified site of left female breast: Secondary | ICD-10-CM | POA: Diagnosis not present

## 2020-10-08 DIAGNOSIS — M6281 Muscle weakness (generalized): Secondary | ICD-10-CM | POA: Diagnosis not present

## 2020-10-08 NOTE — Therapy (Signed)
[place], [place], [address] Phone: [phone]   Fax:  [phone]  Physical Therapy Treatment  Patient Details  Name: Haley [name] MRN: [id number] Date of Birth: 1951/03/16 Referring Provider (PT): Dr. Brantley Stage   Encounter Date: 10/08/2020   PT End of Session - 10/08/20 1346    Visit Number 7    Number of Visits 9    Date for PT Re-Evaluation 10/15/20    PT Start Time 0100    PT Stop Time 0154    PT Time Calculation (min) 54 min    Activity Tolerance Patient tolerated treatment well    Behavior During Therapy [facility] for tasks assessed/performed           Past Medical History:  Diagnosis Date  . Anxiety   . Basal cell carcinoma    removed from right arm   . Bursitis    right   . GERD (gastroesophageal reflux disease)   . Hypercholesteremia   . Hypertension   . PONV (postoperative nausea and vomiting)     Past Surgical History:  Procedure Laterality Date  . BREAST EXCISIONAL BIOPSY Right 1970   benign  . BREAST EXCISIONAL BIOPSY Left 2009   benign LCIS  . BREAST LUMPECTOMY WITH RADIOACTIVE SEED LOCALIZATION Left 04/18/2019   Procedure: LEFT BREAST LUMPECTOMY WITH RADIOACTIVE SEED LOCALIZATION;  Surgeon: Erroll Luna, MD;  Location: [place];  Service: General;  Laterality: Left;  . breast tumor   1970  . INCISIONAL BREAST BIOPSY Left   . MASTECTOMY W/ SENTINEL NODE BIOPSY Bilateral 07/23/2020   Procedure: BILATERAL SIMPLE MASTECTOMIES WITH LEFT SENTINEL LYMPH NODE MAPPING;  Surgeon: Erroll Luna, MD;  Location: [place];  Service: General;  Laterality: Bilateral;  . OPEN SURGICAL REPAIR OF GLUTEAL TENDON Right 05/23/2018   Procedure: right hip bursectomy; gluteal tendon repair;  Surgeon: Gaynelle Arabian, MD;  Location: [facility];  Service: Orthopedics;  Laterality: Right;  51min  . rotator   05/2007   rigth rotator cuff repair     There were no  vitals filed for this visit.   Subjective Assessment - 10/08/20 1300    Subjective Haley [name] hasn't tried working on the cording yet, but he did help me get alot of the glue off. yesterday after doing the PT at home my chest and shoulders were really sore.  Think I overdid it yesterday.  Soreness is better today.    Pertinent History Pt had a left  breast incisional biopsy in 2009, a left lumpectomy in 2020, and a bilateral mastectomy performed on 07/23/20. 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 She does not require radiation or chemotherapy. She has had a right RTC repair and a gluteal tendon repair in 2019.  She also has HBP    Patient Stated Goals to be assessed for progress at this point and need for therapy    Currently in Pain? No/denies                             [facility] Adult PT Treatment/Exercise - 10/08/20 0001      Shoulder Exercises: Standing   Extension Strengthening;Both;10 reps    Theraband Level (Shoulder Extension) Level 1 (Yellow)    Retraction Strengthening;Both;10 reps    Theraband Level (Shoulder Retraction) Level 1 (Yellow)  Shoulder Exercises: Therapy Ball   Flexion 10 reps    ABduction Right;Left;10 reps      Manual Therapy   Manual Therapy Myofascial release;Passive ROM    Myofascial Release In Supine to bilateral axilla and along cording and pectoralis insertion to incision where tightness palpable. Also along bilateral mastectomy incision    Passive ROM In Supine to bilateral shoulder into flexion, abduction and D2 to pts tolerance                    PT Short Term Goals - 09/17/20 1854      PT SHORT TERM GOAL #1   Title Pt will be independent with a HEP for ROM and strengthening of bilateral shoulders    Time 2    Period Weeks    Status New    Target Date 10/01/20             PT Long Term Goals - 09/17/20  1855      PT LONG TERM GOAL #1   Title Pt will report decreased chest tightness by atleast 50%    Time 4    Period Weeks    Status New    Target Date 10/15/20      PT LONG TERM GOAL #2   Title Pt will have bilateral shoulder flexion atleast 150 degrees and abduction atleast 165 degrees for optimal reaching ability for home activities    Time 4    Period Weeks    Status New    Target Date 10/15/20      PT LONG TERM GOAL #3   Title Pt will be educated in risk reduction practices for lymphedema    Time 4    Period Weeks    Status New    Target Date 10/15/20      PT LONG TERM GOAL #5   Title Pt will be educated in long term strengthening program and proper progression    Time 4    Period Weeks    Status New    Target Date 10/15/20                 Plan - 10/08/20 1346    Clinical Impression Statement Pts. cording continues to be less prominent. Still a fairly prominent cord at lower axilla, but remainder that extend to incision are much better. Pts husband has not been able to work with her yet, but will try tonight.PROM continues to improve overall.  Pt does not really care to have a compression sleeve.    Personal Factors and Comorbidities Comorbidity 3+    Comorbidities 3 breast surgeries with lymphedema risk on left, HBP,RTC repair    Stability/Clinical Decision Making Stable/Uncomplicated    Rehab Potential Excellent    PT Frequency 2x / week    PT Duration 4 weeks    PT Treatment/Interventions ADLs/Self Care Home Management;Therapeutic activities;Therapeutic exercise;Neuromuscular re-education;Manual techniques;Patient/family education;Manual lymph drainage;Scar mobilization;Passive range of motion    PT Next Visit Plan cont MFR for cording and to chest, PROM add jobes exs    PT Home Exercise Plan 4 post op exs; doorway stretching, pulleys, supine scapular series    Consulted and Agree with Plan of Care Patient           Patient will benefit from skilled  therapeutic intervention in order to improve the following deficits and impairments:  Decreased activity tolerance,Decreased knowledge of precautions,Decreased range of motion,Decreased skin integrity,Decreased scar mobility,Decreased strength,Increased edema,Impaired sensation,Postural dysfunction,Increased fascial restricitons,Impaired UE  functional use  Visit Diagnosis: Bilateral malignant neoplasm of breast in female, unspecified estrogen receptor status, unspecified site of breast ([place])  Abnormal posture     Problem List Patient Active Problem List   Diagnosis Date Noted  . Counseled about COVID-19 virus  08/18/2020  . Breast abscess   . Wound infection 08/08/2020  . Greater trochanteric bursitis of right hip 05/23/2018  . Trochanteric bursitis, right hip 05/23/2018    Claris Pong 10/08/2020, 1:58 PM  [place], [place], [address] Phone: [phone]   Fax:  [phone]  Name: Kaetlin Bullen Buttram MRN: [id number] Date of Birth: 08/07/1951  Cheral Almas, PT 10/08/20 2:00 PM

## 2020-10-13 ENCOUNTER — Other Ambulatory Visit: Payer: Self-pay

## 2020-10-13 ENCOUNTER — Ambulatory Visit: Payer: Medicare Other

## 2020-10-13 DIAGNOSIS — M6281 Muscle weakness (generalized): Secondary | ICD-10-CM

## 2020-10-13 DIAGNOSIS — R293 Abnormal posture: Secondary | ICD-10-CM | POA: Diagnosis not present

## 2020-10-13 DIAGNOSIS — C50911 Malignant neoplasm of unspecified site of right female breast: Secondary | ICD-10-CM

## 2020-10-13 DIAGNOSIS — C50912 Malignant neoplasm of unspecified site of left female breast: Secondary | ICD-10-CM | POA: Diagnosis not present

## 2020-10-13 NOTE — Therapy (Signed)
[place], [place], [address] Phone: [phone]   Fax:  [phone]  Physical Therapy Treatment  Patient Details  Name: Haley [name] MRN: [id number] Date of Birth: 10-18-1950 Referring Provider (PT): Dr. Brantley Stage   Encounter Date: 10/13/2020   PT End of Session - 10/13/20 1907    Visit Number 8    Number of Visits 9    Date for PT Re-Evaluation 10/15/20    PT Start Time 8185    PT Stop Time 1356    PT Time Calculation (min) 57 min    Activity Tolerance Patient tolerated treatment well    Behavior During Therapy [facility] for tasks assessed/performed           Past Medical History:  Diagnosis Date  . Anxiety   . Basal cell carcinoma    removed from right arm   . Bursitis    right   . GERD (gastroesophageal reflux disease)   . Hypercholesteremia   . Hypertension   . PONV (postoperative nausea and vomiting)     Past Surgical History:  Procedure Laterality Date  . BREAST EXCISIONAL BIOPSY Right 1970   benign  . BREAST EXCISIONAL BIOPSY Left 2009   benign LCIS  . BREAST LUMPECTOMY WITH RADIOACTIVE SEED LOCALIZATION Left 04/18/2019   Procedure: LEFT BREAST LUMPECTOMY WITH RADIOACTIVE SEED LOCALIZATION;  Surgeon: Erroll Luna, MD;  Location: [place];  Service: General;  Laterality: Left;  . breast tumor   1970  . INCISIONAL BREAST BIOPSY Left   . MASTECTOMY W/ SENTINEL NODE BIOPSY Bilateral 07/23/2020   Procedure: BILATERAL SIMPLE MASTECTOMIES WITH LEFT SENTINEL LYMPH NODE MAPPING;  Surgeon: Erroll Luna, MD;  Location: [place];  Service: General;  Laterality: Bilateral;  . OPEN SURGICAL REPAIR OF GLUTEAL TENDON Right 05/23/2018   Procedure: right hip bursectomy; gluteal tendon repair;  Surgeon: Gaynelle Arabian, MD;  [facility];  Service: Orthopedics;  Laterality: Right;  52min  . rotator   05/2007   rigth rotator cuff repair     There were no  vitals filed for this visit.   Subjective Assessment - 10/13/20 1257    Subjective Haley [name] has done some of the MFR techniques and cording. Shoulders were stirred up before the weekend but they are fine now.  Think I overstretched in the corner.  Everything has settled down now. Chest still feels tight    Pertinent History Pt had a left  breast incisional biopsy in 2009, a left lumpectomy in 2020, and a bilateral mastectomy performed on 07/23/20. 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 She does not require radiation or chemotherapy. She has had a right RTC repair and a gluteal tendon repair in 2019.  She also has HBP    Patient Stated Goals to be assessed for progress at this point and need for therapy    Currently in Pain? No/denies    Multiple Pain Sites No                             [facility] Adult PT Treatment/Exercise - 10/13/20 0001      Shoulder Exercises: Stretch   Other Shoulder Stretches standing lat stretch x 3 (15 sec)      Manual Therapy   Manual Therapy Myofascial release;Passive ROM    Myofascial Release In  Supine to bilateral axilla and along cording and pectoralis insertion to incision where tightness palpable. Also along bilateral mastectomy incision    Passive ROM In Supine to bilateral shoulder into flexion, abduction and D2 to pts tolerance                    PT Short Term Goals - 09/17/20 1854      PT SHORT TERM GOAL #1   Title Pt will be independent with a HEP for ROM and strengthening of bilateral shoulders    Time 2    Period Weeks    Status New    Target Date 10/01/20             PT Long Term Goals - 09/17/20 1855      PT LONG TERM GOAL #1   Title Pt will report decreased chest tightness by atleast 50%    Time 4    Period Weeks    Status New    Target Date 10/15/20      PT LONG TERM GOAL #2   Title Pt will have  bilateral shoulder flexion atleast 150 degrees and abduction atleast 165 degrees for optimal reaching ability for home activities    Time 4    Period Weeks    Status New    Target Date 10/15/20      PT LONG TERM GOAL #3   Title Pt will be educated in risk reduction practices for lymphedema    Time 4    Period Weeks    Status New    Target Date 10/15/20      PT LONG TERM GOAL #5   Title Pt will be educated in long term strengthening program and proper progression    Time 4    Period Weeks    Status New    Target Date 10/15/20                 Plan - 10/13/20 1908    Clinical Impression Statement Pts cording is doing very well bilaterally. thick cords running to mastectomy incisions bilaterally barely noticeable.  She still has 2 noticeable cords bilaterally 1 at the anterior axillary region and one more posteriorly which do not interfere much with PROM.  Pt continues to experience shoulder flares at times with exercises done at home because she is being too aggressive.    Personal Factors and Comorbidities Comorbidity 3+    Comorbidities 3 breast surgeries with lymphedema risk on left, HBP,RTC repair    Stability/Clinical Decision Making Stable/Uncomplicated    Rehab Potential Excellent    PT Frequency 2x / week    PT Duration 4 weeks    PT Treatment/Interventions ADLs/Self Care Home Management;Therapeutic activities;Therapeutic exercise;Neuromuscular re-education;Manual techniques;Patient/family education;Manual lymph drainage;Scar mobilization;Passive range of motion    PT Next Visit Plan recert or DC next, give pics of standing TB, review pt pics if she brings.  may benefit from ABC strength class, compression sleeve?    PT Home Exercise Plan 4 post op exs; doorway stretching, pulleys, supine scapular series    Consulted and Agree with Plan of Care Patient           Patient will benefit from skilled therapeutic intervention in order to improve the following deficits and  impairments:  Decreased activity tolerance,Decreased knowledge of precautions,Decreased range of motion,Decreased skin integrity,Decreased scar mobility,Decreased strength,Increased edema,Impaired sensation,Postural dysfunction,Increased fascial restricitons,Impaired UE functional use  Visit Diagnosis: Bilateral malignant neoplasm of breast in female, unspecified estrogen receptor  status, unspecified site of breast ([place])  Abnormal posture  Muscle weakness (generalized)     Problem List Patient Active Problem List   Diagnosis Date Noted  . Counseled about COVID-19 virus  08/18/2020  . Breast abscess   . Wound infection 08/08/2020  . Greater trochanteric bursitis of right hip 05/23/2018  . Trochanteric bursitis, right hip 05/23/2018    Claris Pong 10/13/2020, 7:14 PM  [place], [place], [address] Phone: [phone]   Fax:  [phone]  Name: Haley [name] MRN: [id number] Date of Birth: [birth year] Cheral Almas, PT 10/13/20 7:15 PM

## 2020-10-15 ENCOUNTER — Other Ambulatory Visit: Payer: Self-pay

## 2020-10-15 ENCOUNTER — Ambulatory Visit: Payer: Medicare Other

## 2020-10-15 DIAGNOSIS — C50912 Malignant neoplasm of unspecified site of left female breast: Secondary | ICD-10-CM | POA: Diagnosis not present

## 2020-10-15 DIAGNOSIS — R293 Abnormal posture: Secondary | ICD-10-CM | POA: Diagnosis not present

## 2020-10-15 DIAGNOSIS — M6281 Muscle weakness (generalized): Secondary | ICD-10-CM

## 2020-10-15 DIAGNOSIS — C50911 Malignant neoplasm of unspecified site of right female breast: Secondary | ICD-10-CM | POA: Diagnosis not present

## 2020-10-15 NOTE — Patient Instructions (Signed)
Access Code: [email or web address] URL: [email or web address] Date: 10/15/2020 Prepared by: Cheral Almas  Exercises Scapular Retraction with Resistance - 1 x daily - 3 x weekly - 1 sets - 10 reps Shoulder Extension with Resistance - 1 x daily - 3 x weekly - 1 sets - 10 reps Shoulder External Rotation and Scapular Retraction with Resistance - 1 x daily - 10 reps

## 2020-10-15 NOTE — Therapy (Addendum)
[place], [place], [address] Phone: [phone]   Fax:  [phone]  Physical Therapy Treatment  Patient Details  Name: Haley [name] MRN: [id number] Date of Birth: 30-Jan-1951 Referring Provider (PT): Dr. Brantley Stage   Encounter Date: 10/15/2020   PT End of Session - 10/15/20 1729    Visit Number 9    Number of Visits 21    Date for PT Re-Evaluation 11/26/20    PT Start Time 1301    PT Stop Time 1402    PT Time Calculation (min) 61 min    Activity Tolerance Patient tolerated treatment well    Behavior During Therapy [facility] for tasks assessed/performed           Past Medical History:  Diagnosis Date  . Anxiety   . Basal cell carcinoma    removed from right arm   . Bursitis    right   . GERD (gastroesophageal reflux disease)   . Hypercholesteremia   . Hypertension   . PONV (postoperative nausea and vomiting)     Past Surgical History:  Procedure Laterality Date  . BREAST EXCISIONAL BIOPSY Right 1970   benign  . BREAST EXCISIONAL BIOPSY Left 2009   benign LCIS  . BREAST LUMPECTOMY WITH RADIOACTIVE SEED LOCALIZATION Left 04/18/2019   Procedure: LEFT BREAST LUMPECTOMY WITH RADIOACTIVE SEED LOCALIZATION;  Surgeon: Erroll Luna, MD;  Location: [place];  Service: General;  Laterality: Left;  . breast tumor   1970  . INCISIONAL BREAST BIOPSY Left   . MASTECTOMY W/ SENTINEL NODE BIOPSY Bilateral 07/23/2020   Procedure: BILATERAL SIMPLE MASTECTOMIES WITH LEFT SENTINEL LYMPH NODE MAPPING;  Surgeon: Erroll Luna, MD;  Location: [place];  Service: General;  Laterality: Bilateral;  . OPEN SURGICAL REPAIR OF GLUTEAL TENDON Right 05/23/2018   Procedure: right hip bursectomy; gluteal tendon repair;  Surgeon: Gaynelle Arabian, MD;  [facility];  Service: Orthopedics;  Laterality: Right;  26min  . rotator   05/2007   rigth rotator cuff repair     There were no  vitals filed for this visit.   Subjective Assessment - 10/15/20 1300    Subjective Haley [name] has not done any of the techniques at home, but I have been doing my exercises 2xs /day.  I was ...even doing the band 2xs per day.  Feel like I am doing most normal things at home now and haven't had to limit myself.  Still bothered by the tightness in my chest.    Pertinent History Pt had a left  breast incisional biopsy in 2009, a left lumpectomy in 2020, and a bilateral mastectomy performed on 07/23/20. 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 She does not require radiation or chemotherapy. She has had a right RTC repair and a gluteal tendon repair in 2019.  She also has HBP    Patient Stated Goals to be assessed for progress at this point and need for therapy    Currently in Pain? No/denies    Multiple Pain Sites No              [facility] PT Assessment - 10/15/20 0001      AROM   Right Shoulder Extension 74 Degrees    Right Shoulder Flexion 154 Degrees    Right Shoulder ABduction 170 Degrees    Right Shoulder Internal Rotation 70 Degrees  Right Shoulder External Rotation 100 Degrees    Left Shoulder Extension 75 Degrees    Left Shoulder Flexion 160 Degrees    Left Shoulder ABduction 170 Degrees    Left Shoulder Internal Rotation 75 Degrees    Left Shoulder External Rotation 95 Degrees             LYMPHEDEMA/ONCOLOGY QUESTIONNAIRE - 10/15/20 0001      Left Upper Extremity Lymphedema   15 cm Proximal to Olecranon Process 33.5 cm    10 cm Proximal to Olecranon Process 31.1 cm    Olecranon Process 26 cm    15 cm Proximal to Ulnar Styloid Process 23.1 cm    10 cm Proximal to Ulnar Styloid Process 20.7 cm    Just Proximal to Ulnar Styloid Process 16.2 cm    At Base of 2nd Digit 5.7 cm                              PT Education - 10/15/20 1726    Education  Details pt was educated in standing scap retraction, ext and bilateral ER.  She was educated to do 2-3x's per week    Person(s) Educated Patient    Methods Explanation;Demonstration;Handout    Comprehension Returned demonstration            PT Short Term Goals - 10/15/20 1320      PT SHORT TERM GOAL #1   Title Pt will be independent with a HEP for ROM and strengthening of bilateral shoulders    Time 2    Period Weeks    Status Achieved             PT Long Term Goals - 10/15/20 1321      PT LONG TERM GOAL #1   Title Pt will report decreased chest tightness by atleast 50%    Baseline 30% better    Period Weeks    Status On-going      PT LONG TERM GOAL #2   Title Pt will have bilateral shoulder flexion atleast 150 degrees and abduction atleast 165 degrees for optimal reaching ability for home activities    Time 4    Period Weeks achieved     PT LONG TERM GOAL #5   Title Pt will be educated in long term strengthening program and proper progression    Time 4    Period Weeks    Status On-going                 Plan - 10/15/20 1731    Clinical Impression Statement Pt is making good progress and she has achieved goals for shoulder ROM, HEP, and understanding of precautions. She does still have cording which is much less prominent and her husband has been instructed how to help release them.  She is compliant with her HEP but has a tendency to overdo and do too much aggravating her shoulders.  We discussed this at length today.  She will benefit from [facility] techniques to the chest region and cording, as well as instruction in the ABC strength program.    Personal Factors and Comorbidities Comorbidity 3+    Comorbidities 3 breast surgeries with lymphedema risk on left, HBP,RTC repair    Stability/Clinical Decision Making Stable/Uncomplicated    Rehab Potential Excellent    PT Frequency 2x / week    PT Duration 6 weeks    PT Treatment/Interventions ADLs/Self Care Home  Management;Therapeutic  activities;Therapeutic exercise;Neuromuscular re-education;Manual techniques;Patient/family education;Manual lymph drainage;Scar mobilization;Passive range of motion    PT Next Visit Plan cont 1-2X/week, MFR to chest/cording/ABC strength info    PT Home Exercise Plan 4 post op exs; doorway stretching, pulleys, supine scapular series, standing theraband    Consulted and Agree with Plan of Care Patient           Patient will benefit from skilled therapeutic intervention in order to improve the following deficits and impairments:  Decreased activity tolerance,Decreased knowledge of precautions,Decreased range of motion,Decreased skin integrity,Decreased scar mobility,Decreased strength,Increased edema,Impaired sensation,Postural dysfunction,Increased fascial restricitons,Impaired UE functional use  Visit Diagnosis: Abnormal posture  Muscle weakness (generalized)     Problem List Patient Active Problem List   Diagnosis Date Noted  . Counseled about COVID-19 virus  08/18/2020  . Breast abscess   . Wound infection 08/08/2020  . Greater trochanteric bursitis of right hip 05/23/2018  . Trochanteric bursitis, right hip 05/23/2018    Elsie Ra [facility] 10/15/2020, 5:37 PM  [place], [place], [address] Phone: [phone]   Fax:  [phone]  Name: Margel Joens Ferg MRN: [id number] Date of Birth: 1951-06-15 Cheral Almas, PT 10/15/20 5:40 PM

## 2020-10-19 ENCOUNTER — Other Ambulatory Visit: Payer: Self-pay

## 2020-10-19 ENCOUNTER — Ambulatory Visit: Payer: Medicare Other

## 2020-10-19 DIAGNOSIS — M6281 Muscle weakness (generalized): Secondary | ICD-10-CM

## 2020-10-19 DIAGNOSIS — C50912 Malignant neoplasm of unspecified site of left female breast: Secondary | ICD-10-CM

## 2020-10-19 DIAGNOSIS — R293 Abnormal posture: Secondary | ICD-10-CM | POA: Diagnosis not present

## 2020-10-19 DIAGNOSIS — C50911 Malignant neoplasm of unspecified site of right female breast: Secondary | ICD-10-CM | POA: Diagnosis not present

## 2020-10-19 NOTE — Therapy (Signed)
[place], [place], [address] Phone: [phone]   Fax:  [phone]  Physical Therapy Treatment  Patient Details  Name: Haley [name] MRN: [id number] Date of Birth: 1951/03/13 Referring Provider (PT): Dr. Brantley Stage   Encounter Date: 10/19/2020   PT End of Session - 10/19/20 1737    Visit Number 10    Number of Visits 21    Date for PT Re-Evaluation 11/26/20    PT Start Time 1502    PT Stop Time 1600    PT Time Calculation (min) 58 min    Activity Tolerance Patient tolerated treatment well    Behavior During Therapy [facility] for tasks assessed/performed           Past Medical History:  Diagnosis Date  . Anxiety   . Basal cell carcinoma    removed from right arm   . Bursitis    right   . GERD (gastroesophageal reflux disease)   . Hypercholesteremia   . Hypertension   . PONV (postoperative nausea and vomiting)     Past Surgical History:  Procedure Laterality Date  . BREAST EXCISIONAL BIOPSY Right 1970   benign  . BREAST EXCISIONAL BIOPSY Left 2009   benign LCIS  . BREAST LUMPECTOMY WITH RADIOACTIVE SEED LOCALIZATION Left 04/18/2019   Procedure: LEFT BREAST LUMPECTOMY WITH RADIOACTIVE SEED LOCALIZATION;  Surgeon: Erroll Luna, MD;  Location: [place];  Service: General;  Laterality: Left;  . breast tumor   1970  . INCISIONAL BREAST BIOPSY Left   . MASTECTOMY W/ SENTINEL NODE BIOPSY Bilateral 07/23/2020   Procedure: BILATERAL SIMPLE MASTECTOMIES WITH LEFT SENTINEL LYMPH NODE MAPPING;  Surgeon: Erroll Luna, MD;  Location: [place];  Service: General;  Laterality: Bilateral;  . OPEN SURGICAL REPAIR OF GLUTEAL TENDON Right 05/23/2018   Procedure: right hip bursectomy; gluteal tendon repair;  Surgeon: Gaynelle Arabian, MD;  [facility];  Service: Orthopedics;  Laterality: Right;  68min  . rotator   05/2007   rigth rotator cuff repair     There were no  vitals filed for this visit.   Subjective Assessment - 10/19/20 1502    Subjective I was discouraged after I left because I have been overdoing it.  Both of my shoulders from time to time feel inflamed, and sometimes I get a little click in my right shoulder. I have been doing the theraband exercises every day and I shouldn't have been.  I think all the glue may be gone now.  [name] worked on the cords, and My chest still feels pretty tight. The theraband exs didn't say how many times per day to do them so I assumed it was 2x perday.  Pt also notes that since she didn't come through the Cancer center ( she didn't require chemo or radiation)she never had any education on PT, counseling or other things individuals that go through the cancer center have and it made her feel unimportant. She learned about our ABC class on her own, and requested to have PT. (I will try and look into this for her)   Pertinent History Pt had a left  breast incisional biopsy in 2009, a left lumpectomy in 2020, and a bilateral mastectomy performed on 07/23/20. 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 She does not require radiation or chemotherapy. She has had a right RTC repair and  a gluteal tendon repair in 2019.  She also has HBP    Patient Stated Goals to be assessed for progress at this point and need for therapy    Currently in Pain? No/denies                             [facility] Adult PT Treatment/Exercise - 10/19/20 0001      Shoulder Exercises: Supine   Other Supine Exercises AROM bilateral flexion, scaption, horizontal abd x 5 ea      Shoulder Exercises: Standing   Extension Strengthening;Both;10 reps    Theraband Level (Shoulder Extension) Level 1 (Yellow)    Retraction Strengthening;Both;10 reps    Theraband Level (Shoulder Retraction) Level 1 (Yellow)      Manual Therapy   Manual  Therapy Myofascial release;Passive ROM    Myofascial Release In Supine to bilateral axilla and along cording and pectoralis insertion to incision where tightness palpable. Also along bilateral mastectomy incision    Passive ROM In Supine to bilateral shoulder into flexion, abduction and D2 to pts tolerance                    PT Short Term Goals - 10/15/20 1320      PT SHORT TERM GOAL #1   Title Pt will be independent with a HEP for ROM and strengthening of bilateral shoulders    Time 2    Period Weeks    Status Achieved             PT Long Term Goals - 10/15/20 1321      PT LONG TERM GOAL #1   Title Pt will report decreased chest tightness by atleast 50%    Baseline 30% better    Period Weeks    Status On-going      PT LONG TERM GOAL #2   Title Pt will have bilateral shoulder flexion atleast 150 degrees and abduction atleast 165 degrees for optimal reaching ability for home activities    Time 4    Period Weeks      PT LONG TERM GOAL #5   Title Pt will be educated in long term strengthening program and proper progression    Time 4    Period Weeks    Status On-going                 Plan - 10/19/20 1738    Clinical Impression Statement Pt continues to make good progress.  She does still have several cords bilaterally that are still present but do not tend to interfere with her shoulder ROM.  Chest area around incision continues to be tight for pt. but appears improved.  Her incisions look excellent now that all the glue has finally come off.  Pt was distraught that she may have been overdoing, but I explained that while I don't want her to do the theraband but 2-3 times per week, if she was doing them properly and only 10 reps they would not hurt her.  Reminded her to always listen to her body and discontinue anything that is painful or just doesn't feel right to her.  We ran out of time secondary to length discussion and manual work and will go over ABC  strength next visit.  She was also very disappointed that after her surgery she was not advised of PT, counseling etc until she herself asked for assistance.  She was never seen at the  Cancer center because she did not require chemo or radiation.    Personal Factors and Comorbidities Comorbidity 3+    Comorbidities 3 breast surgeries with lymphedema risk on left, HBP,RTC repair    Stability/Clinical Decision Making Stable/Uncomplicated    Rehab Potential Excellent    PT Frequency 2x / week    PT Duration 6 weeks    PT Treatment/Interventions ADLs/Self Care Home Management;Therapeutic activities;Therapeutic exercise;Neuromuscular re-education;Manual techniques;Patient/family education;Manual lymph drainage;Scar mobilization;Passive range of motion    PT Next Visit Plan ABC strength next visit, MFR/cording prn    PT Home Exercise Plan 4 post op exs; doorway stretching, pulleys, supine scapular series, standing theraband    Consulted and Agree with Plan of Care Patient           Patient will benefit from skilled therapeutic intervention in order to improve the following deficits and impairments:  Decreased activity tolerance,Decreased knowledge of precautions,Decreased range of motion,Decreased skin integrity,Decreased scar mobility,Decreased strength,Increased edema,Impaired sensation,Postural dysfunction,Increased fascial restricitons,Impaired UE functional use  Visit Diagnosis: Abnormal posture  Muscle weakness (generalized)  Bilateral malignant neoplasm of breast in female, unspecified estrogen receptor status, unspecified site of breast [facility])     Problem List Patient Active Problem List   Diagnosis Date Noted  . Counseled about COVID-19 virus  08/18/2020  . Breast abscess   . Wound infection 08/08/2020  . Greater trochanteric bursitis of right hip 05/23/2018  . Trochanteric bursitis, right hip 05/23/2018    Elsie Ra [facility] 10/19/2020, 5:47 PM  [place], [place], [address] Phone: [phone]   Fax:  [phone]  Name: Haley [name] MRN: [id number] Date of Birth: 13-May-1951  Cheral Almas, PT 10/19/20 5:50 PM

## 2020-10-22 ENCOUNTER — Other Ambulatory Visit: Payer: Self-pay

## 2020-10-22 ENCOUNTER — Ambulatory Visit: Payer: Medicare Other

## 2020-10-22 DIAGNOSIS — C50911 Malignant neoplasm of unspecified site of right female breast: Secondary | ICD-10-CM | POA: Diagnosis not present

## 2020-10-22 DIAGNOSIS — M6281 Muscle weakness (generalized): Secondary | ICD-10-CM | POA: Diagnosis not present

## 2020-10-22 DIAGNOSIS — R293 Abnormal posture: Secondary | ICD-10-CM | POA: Diagnosis not present

## 2020-10-22 DIAGNOSIS — C50912 Malignant neoplasm of unspecified site of left female breast: Secondary | ICD-10-CM | POA: Diagnosis not present

## 2020-10-22 NOTE — Therapy (Signed)
[place], [place], [address] Phone: [phone]   Fax:  [phone]  Physical Therapy Treatment  Patient Details  Name: Haley [name] MRN: [id number] Date of Birth: 1950/09/27 Referring Provider (PT): Dr. Brantley Stage   Encounter Date: 10/22/2020   PT End of Session - 10/22/20 2110    Visit Number 11    Number of Visits 21    Date for PT Re-Evaluation 11/26/20    PT Start Time 1302    PT Stop Time 1400    PT Time Calculation (min) 58 min    Activity Tolerance Patient tolerated treatment well    Behavior During Therapy [facility] for tasks assessed/performed           Past Medical History:  Diagnosis Date  . Anxiety   . Basal cell carcinoma    removed from right arm   . Bursitis    right   . GERD (gastroesophageal reflux disease)   . Hypercholesteremia   . Hypertension   . PONV (postoperative nausea and vomiting)     Past Surgical History:  Procedure Laterality Date  . BREAST EXCISIONAL BIOPSY Right 1970   benign  . BREAST EXCISIONAL BIOPSY Left 2009   benign LCIS  . BREAST LUMPECTOMY WITH RADIOACTIVE SEED LOCALIZATION Left 04/18/2019   Procedure: LEFT BREAST LUMPECTOMY WITH RADIOACTIVE SEED LOCALIZATION;  Surgeon: Erroll Luna, MD;  Location: [place];  Service: General;  Laterality: Left;  . breast tumor   1970  . INCISIONAL BREAST BIOPSY Left   . MASTECTOMY W/ SENTINEL NODE BIOPSY Bilateral 07/23/2020   Procedure: BILATERAL SIMPLE MASTECTOMIES WITH LEFT SENTINEL LYMPH NODE MAPPING;  Surgeon: Erroll Luna, MD;  Location: [place];  Service: General;  Laterality: Bilateral;  . OPEN SURGICAL REPAIR OF GLUTEAL TENDON Right 05/23/2018   Procedure: right hip bursectomy; gluteal tendon repair;  Surgeon: Gaynelle Arabian, MD;  [facility];  Service: Orthopedics;  Laterality: Right;  46min  . rotator   05/2007   rigth rotator cuff repair     There were no  vitals filed for this visit.   Subjective Assessment - 10/22/20 1300    Subjective Doing well today.  I could do most everything fine. Did the band exercises this am. I had a rough time in the right chest/shoulder area and was very aware of the scar over the last couple of days.  I think the numbness is gone away.  Its. not a pain, just felt more like something really tight in the scars. If I pull up on my skin that seems like it relieves it. What do you think about taping it.    Pertinent History Pt had a left  breast incisional biopsy in 2009, a left lumpectomy in 2020, and a bilateral mastectomy performed on 07/23/20. 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 She does not require radiation or chemotherapy. She has had a right RTC repair and a gluteal tendon repair in 2019.  She also has HBP    Patient Stated Goals to be assessed for progress at this point and need for therapy    Currently in Pain? No/denies                                     PT Education -  10/22/20 2104    Education Details Pt was educated in [facility] strength booklet performing a 5 minute bike warm up, and stretches 1 rep each side with modification to posterior shoulder stretch using opposite arm.  We left out standing quad stretch, and modified sitting bilateral butterfly stretch to do erect sitting with ankle over opposite knee.  She performed chest press  with 1 lb and tried mini squats but left out secondary to poor form and knee crepitus. She will do 5 sit to stands instead. She will do 10 reps of strength exs performed in clinic today.    Person(s) Educated Patient    Methods Explanation;Demonstration;Handout;Verbal cues    Comprehension Verbalized understanding;Returned demonstration;Verbal cues required   held squats secondary to crepitus and poor form.  held standing quad stretch secondary to  difficulty           PT Short Term Goals - 10/15/20 1320      PT SHORT TERM GOAL #1   Title Pt will be independent with a HEP for ROM and strengthening of bilateral shoulders    Time 2    Period Weeks    Status Achieved             PT Long Term Goals - 10/22/20 2121      PT LONG TERM GOAL #1   Title Pt will report decreased chest tightness by atleast 50%    Baseline 30% better    Time 4    Period Weeks    Status On-going      PT LONG TERM GOAL #2   Title Pt will have bilateral shoulder flexion atleast 150 degrees and abduction atleast 165 degrees for optimal reaching ability for home activities    Time 4    Period Weeks    Status Achieved      PT LONG TERM GOAL #3   Title Pt will be educated in risk reduction practices for lymphedema    Time 4    Period Weeks    Status On-going      PT LONG TERM GOAL #5   Title Pt will be educated in long term strengthening program and proper progression    Time 4    Period Weeks    Status On-going                 Plan - 10/22/20 2112    Clinical Impression Statement Pt was instructed in ABC strength pamphlet and reviewed warm up, stretches and part of strength exs.  Selected exs were modified including posterior shoulder stretch, and sitting butterfly stretch. Quad stretch was left out and mini squats were left out secondary to poor form.  She performed sit to stand instead.  We stopped after sit to stand and will complete the remainder of the exs next time. The only core exercises perfomed today were bridges.  She used good form for most other exs.  She will do 10 reps of strength exs.    Personal Factors and Comorbidities Comorbidity 3+    Comorbidities 3 breast surgeries with lymphedema risk on left, HBP,RTC repair    Stability/Clinical Decision Making Stable/Uncomplicated    Rehab Potential Excellent    PT Frequency 2x / week    PT Duration 6 weeks    PT Treatment/Interventions ADLs/Self Care Home  Management;Therapeutic activities;Therapeutic exercise;Neuromuscular re-education;Manual techniques;Patient/family education;Manual lymph drainage;Scar mobilization;Passive range of motion    PT Next Visit Plan ABC strength next visit, MFR/cording prn    PT Home  Exercise Plan 4 post op exs; doorway stretching, pulleys, supine scapular series, standing theraband    Consulted and Agree with Plan of Care Patient           Patient will benefit from skilled therapeutic intervention in order to improve the following deficits and impairments:  Decreased activity tolerance,Decreased knowledge of precautions,Decreased range of motion,Decreased skin integrity,Decreased scar mobility,Decreased strength,Increased edema,Impaired sensation,Postural dysfunction,Increased fascial restricitons,Impaired UE functional use  Visit Diagnosis: Abnormal posture  Muscle weakness (generalized)  Bilateral malignant neoplasm of breast in female, unspecified estrogen receptor status, unspecified site of breast [facility])     Problem List Patient Active Problem List   Diagnosis Date Noted  . Counseled about COVID-19 virus  08/18/2020  . Breast abscess   . Wound infection 08/08/2020  . Greater trochanteric bursitis of right hip 05/23/2018  . Trochanteric bursitis, right hip 05/23/2018    Elsie Ra [facility] 10/22/2020, 9:23 PM  [place], [place], [address] Phone: [phone]   Fax:  [phone]  Name: Petra Dumler Stroschein MRN: [id number] Date of Birth: 10/06/1950  Cheral Almas, PT 10/22/20 9:25 PM

## 2020-10-22 NOTE — Patient Instructions (Signed)
Pt was given ABC strength program.  She was advised to read instructions carefully.  She may do the warm up, stretches  and exs up to sit to stand.  We modified the posterior shoulder stretch to do with the opposite hand and not the wall.  We also modified the sitting bilateral butterfly to sitting in a chair and crossing one ankle over the opposite knee, and then repeating with the opposite side. She will not to mini squats but may do 5 sit to stands.  She will do 1 rep each side of all the stretches and 10 reps of strength exs except sit to stand x 5.  We left out standing quad stretch.

## 2020-10-27 DIAGNOSIS — H5203 Hypermetropia, bilateral: Secondary | ICD-10-CM | POA: Diagnosis not present

## 2020-10-27 DIAGNOSIS — H2513 Age-related nuclear cataract, bilateral: Secondary | ICD-10-CM | POA: Diagnosis not present

## 2020-10-28 ENCOUNTER — Ambulatory Visit: Payer: Medicare Other

## 2020-11-04 ENCOUNTER — Other Ambulatory Visit: Payer: Self-pay

## 2020-11-04 ENCOUNTER — Ambulatory Visit: Payer: Medicare Other

## 2020-11-04 DIAGNOSIS — R293 Abnormal posture: Secondary | ICD-10-CM

## 2020-11-04 DIAGNOSIS — C50912 Malignant neoplasm of unspecified site of left female breast: Secondary | ICD-10-CM

## 2020-11-04 DIAGNOSIS — M6281 Muscle weakness (generalized): Secondary | ICD-10-CM

## 2020-11-04 DIAGNOSIS — C50911 Malignant neoplasm of unspecified site of right female breast: Secondary | ICD-10-CM

## 2020-11-04 NOTE — Therapy (Addendum)
[place], [place], [address] Phone: [phone]   Fax:  [phone]  Physical Therapy Treatment  Patient Details  Name: Haley [name] MRN: [id number] Date of Birth: 08-Sep-1950 Referring Provider (PT): Dr. Brantley Stage   Encounter Date: 11/04/2020   PT End of Session - 11/04/20 1910    Visit Number 12    Number of Visits 21    Date for PT Re-Evaluation 11/26/20    PT Start Time 1300    PT Stop Time 1402    PT Time Calculation (min) 62 min    Activity Tolerance Patient tolerated treatment well    Behavior During Therapy [facility] for tasks assessed/performed           Past Medical History:  Diagnosis Date  . Anxiety   . Basal cell carcinoma    removed from right arm   . Bursitis    right   . GERD (gastroesophageal reflux disease)   . Hypercholesteremia   . Hypertension   . PONV (postoperative nausea and vomiting)     Past Surgical History:  Procedure Laterality Date  . BREAST EXCISIONAL BIOPSY Right 1970   benign  . BREAST EXCISIONAL BIOPSY Left 2009   benign LCIS  . BREAST LUMPECTOMY WITH RADIOACTIVE SEED LOCALIZATION Left 04/18/2019   Procedure: LEFT BREAST LUMPECTOMY WITH RADIOACTIVE SEED LOCALIZATION;  Surgeon: Erroll Luna, MD;  Location: [place];  Service: General;  Laterality: Left;  . breast tumor   1970  . INCISIONAL BREAST BIOPSY Left   . MASTECTOMY W/ SENTINEL NODE BIOPSY Bilateral 07/23/2020   Procedure: BILATERAL SIMPLE MASTECTOMIES WITH LEFT SENTINEL LYMPH NODE MAPPING;  Surgeon: Erroll Luna, MD;  Location: [place];  Service: General;  Laterality: Bilateral;  . OPEN SURGICAL REPAIR OF GLUTEAL TENDON Right 05/23/2018   Procedure: right hip bursectomy; gluteal tendon repair;  Surgeon: Gaynelle Arabian, MD;  [facility];  Service: Orthopedics;  Laterality: Right;  6mn  . rotator   05/2007   rigth rotator cuff repair     There were no  vitals filed for this visit.   Subjective Assessment - 11/04/20 1258    Subjective Feel like I am doing well with everything. JDellis Filberthas been working on the cords and stretches every other day.  He can barely find them anymore.  Tightness is still present in the chest but I do think it is getting better. The prosthesis tends to pull my bra down. Feel like there may be some swelling in my chest.  I have been working on my exercises and keeping track of them on a flow sheet    Pertinent History Pt had a left  breast incisional biopsy in 2009, a left lumpectomy in 2020, and a bilateral mastectomy performed on 07/23/20. 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 She does not require radiation or chemotherapy. She has had a right RTC repair and a gluteal tendon repair in 2019.  She also has HBP    Patient Stated Goals to be assessed for progress at this point and need for therapy    Currently in Pain? No/denies                             [facility]      Manual Therapy  Edema Management chippacks made for areas below bilateral incisions to see if this is swelling and can be relieved.  Advised to place in compression bra                  PT Education - 11/04/20 1908    Education Details Pt was educated in ABC strength booklet for exercises starting after sit to stand.  left out dead lifts secondary to PT concern that it would bother her back.  Also did not get to do the core exs and these will be reviewed next time    Person(s) Educated Patient    Methods Explanation;Demonstration;Handout    Comprehension Returned demonstration            PT Short Term Goals - 10/15/20 1320      PT SHORT TERM GOAL #1   Title Pt will be independent with a HEP for ROM and strengthening of bilateral shoulders    Time 2    Period Weeks     Status Achieved             PT Long Term Goals - 11/04/20 1922      PT LONG TERM GOAL #1   Title Pt will report decreased chest tightness by atleast 50%    Time 4    Period Weeks    Status Achieved      PT LONG TERM GOAL #2   Title Pt will have bilateral shoulder flexion atleast 150 degrees and abduction atleast 165 degrees for optimal reaching ability for home activities    Time 4    Period Weeks    Status Achieved      PT LONG TERM GOAL #3   Title Pt will be educated in risk reduction practices for lymphedema    Time 4    Period Weeks    Status On-going      PT LONG TERM GOAL #4   Title Pt will have decreased chest swelling with use of chip pack    Period Weeks    Status New    Target Date 11/26/20      PT LONG TERM GOAL #5   Title Pt will be educated in long term strengthening program and proper progression    Time 4    Period Weeks    Status On-going                 Plan - 11/04/20 1911    Clinical Impression Statement Therapy consisted of instruction in ABC strength exs after sit to stand.  Dead lifts were left out secondary to concern of back pain and core exs were not performed today secondary to lack of time.  She did well with other exs instructed and required very few verbal cues/tactile cues. We discussed continuing postural theraband exs, but possibly discontinuing supine theraband exercises.  Also discussed decreasing stretches to 1x per day unless she feels tight.  Pt does appear to have some swelling inferior to her incision and 2 chip packs were made for her to try in these areas.  She was instructed to place in her compression bra for most benefit. She will follow up one more time to check benefit of chip pack and to instruct core strength.  Pts mastectomy incisions are well healed and very mobile.    Personal Factors and Comorbidities Comorbidity 3+    Comorbidities 3 breast surgeries with lymphedema risk on left, HBP,RTC repair     Stability/Clinical Decision Making Stable/Uncomplicated      Rehab Potential Excellent    PT Frequency 2x / week    PT Duration 6 weeks    PT Treatment/Interventions ADLs/Self Care Home Management;Therapeutic activities;Therapeutic exercise;Neuromuscular re-education;Manual techniques;Patient/family education;Manual lymph drainage;Scar mobilization;Passive range of motion    PT Next Visit Plan ABC strength (core strength), check benefit of chip pack    PT Home Exercise Plan 4 post op exs; doorway stretching, pulleys, supine scapular series, standing theraband, ABC strength    Consulted and Agree with Plan of Care Patient           Patient will benefit from skilled therapeutic intervention in order to improve the following deficits and impairments:  Decreased activity tolerance,Decreased knowledge of precautions,Decreased range of motion,Decreased skin integrity,Decreased scar mobility,Decreased strength,Increased edema,Impaired sensation,Postural dysfunction,Increased fascial restricitons,Impaired UE functional use  Visit Diagnosis: Abnormal posture  Muscle weakness (generalized)  Bilateral malignant neoplasm of breast in female, unspecified estrogen receptor status, unspecified site of breast (HCC)     Problem List Patient Active Problem List   Diagnosis Date Noted  . Counseled about COVID-19 virus  08/18/2020  . Breast abscess   . Wound infection 08/08/2020  . Greater trochanteric bursitis of right hip 05/23/2018  . Trochanteric bursitis, right hip 05/23/2018   PHYSICAL THERAPY DISCHARGE SUMMARY  Visits from Start of Care: 12  Current functional level related to goals / functional outcomes: Pt had achieved or nearly achieved all goals established.  She called to say she was doing very well and that I may discharge her chart   Remaining deficits: None noted at this time   Education / Equipment: NA  Plan: Patient agrees to discharge.  Patient goals were partially met.  Patient is being discharged due to being pleased with the current functional level.  ?????      Robin J Walcott 11/04/2020, 7:25 PM Robin Walcott, PT 12/01/20 7:41 AM  [facility] [address] [place], [place], [address] Phone: [phone]   Fax:  [phone]  Name: Merilyn B Cullifer MRN: [id number] Date of Birth: 04/30/1951  Robin Walcott, PT 11/04/20 7:25 PM  

## 2020-11-12 DIAGNOSIS — H25811 Combined forms of age-related cataract, right eye: Secondary | ICD-10-CM | POA: Diagnosis not present

## 2020-11-12 DIAGNOSIS — H2511 Age-related nuclear cataract, right eye: Secondary | ICD-10-CM | POA: Diagnosis not present

## 2020-11-26 DIAGNOSIS — H25812 Combined forms of age-related cataract, left eye: Secondary | ICD-10-CM | POA: Diagnosis not present

## 2020-11-26 DIAGNOSIS — H2512 Age-related nuclear cataract, left eye: Secondary | ICD-10-CM | POA: Diagnosis not present

## 2020-11-30 DIAGNOSIS — D0512 Intraductal carcinoma in situ of left breast: Secondary | ICD-10-CM | POA: Diagnosis not present

## 2020-12-25 DIAGNOSIS — Z23 Encounter for immunization: Secondary | ICD-10-CM | POA: Diagnosis not present

## 2020-12-31 DIAGNOSIS — L570 Actinic keratosis: Secondary | ICD-10-CM | POA: Diagnosis not present

## 2020-12-31 DIAGNOSIS — L700 Acne vulgaris: Secondary | ICD-10-CM | POA: Diagnosis not present

## 2020-12-31 DIAGNOSIS — L910 Hypertrophic scar: Secondary | ICD-10-CM | POA: Diagnosis not present

## 2021-01-01 DIAGNOSIS — T1501XA Foreign body in cornea, right eye, initial encounter: Secondary | ICD-10-CM | POA: Diagnosis not present

## 2021-01-21 DIAGNOSIS — H53141 Visual discomfort, right eye: Secondary | ICD-10-CM | POA: Diagnosis not present

## 2021-01-21 DIAGNOSIS — H04221 Epiphora due to insufficient drainage, right lacrimal gland: Secondary | ICD-10-CM | POA: Diagnosis not present

## 2021-02-05 DIAGNOSIS — J302 Other seasonal allergic rhinitis: Secondary | ICD-10-CM | POA: Diagnosis not present

## 2021-02-17 DIAGNOSIS — H43813 Vitreous degeneration, bilateral: Secondary | ICD-10-CM | POA: Diagnosis not present

## 2021-03-02 DIAGNOSIS — H04223 Epiphora due to insufficient drainage, bilateral lacrimal glands: Secondary | ICD-10-CM | POA: Diagnosis not present

## 2021-03-02 DIAGNOSIS — H04563 Stenosis of bilateral lacrimal punctum: Secondary | ICD-10-CM | POA: Diagnosis not present

## 2021-03-02 DIAGNOSIS — H57813 Brow ptosis, bilateral: Secondary | ICD-10-CM | POA: Diagnosis not present

## 2021-03-02 DIAGNOSIS — H02834 Dermatochalasis of left upper eyelid: Secondary | ICD-10-CM | POA: Diagnosis not present

## 2021-03-02 DIAGNOSIS — H02831 Dermatochalasis of right upper eyelid: Secondary | ICD-10-CM | POA: Diagnosis not present

## 2021-03-03 DIAGNOSIS — H04211 Epiphora due to excess lacrimation, right lacrimal gland: Secondary | ICD-10-CM | POA: Diagnosis not present

## 2021-03-03 DIAGNOSIS — H43813 Vitreous degeneration, bilateral: Secondary | ICD-10-CM | POA: Diagnosis not present

## 2021-03-08 DIAGNOSIS — L910 Hypertrophic scar: Secondary | ICD-10-CM | POA: Diagnosis not present

## 2021-04-15 DIAGNOSIS — F419 Anxiety disorder, unspecified: Secondary | ICD-10-CM | POA: Diagnosis not present

## 2021-04-15 DIAGNOSIS — I700 Atherosclerosis of aorta: Secondary | ICD-10-CM | POA: Diagnosis not present

## 2021-04-15 DIAGNOSIS — Z9013 Acquired absence of bilateral breasts and nipples: Secondary | ICD-10-CM | POA: Diagnosis not present

## 2021-04-15 DIAGNOSIS — E785 Hyperlipidemia, unspecified: Secondary | ICD-10-CM | POA: Diagnosis not present

## 2021-04-15 DIAGNOSIS — I10 Essential (primary) hypertension: Secondary | ICD-10-CM | POA: Diagnosis not present

## 2021-04-15 DIAGNOSIS — Z0000 Encounter for general adult medical examination without abnormal findings: Secondary | ICD-10-CM | POA: Diagnosis not present

## 2021-04-15 DIAGNOSIS — E559 Vitamin D deficiency, unspecified: Secondary | ICD-10-CM | POA: Diagnosis not present

## 2021-04-15 DIAGNOSIS — G4700 Insomnia, unspecified: Secondary | ICD-10-CM | POA: Diagnosis not present

## 2021-04-15 DIAGNOSIS — E2839 Other primary ovarian failure: Secondary | ICD-10-CM | POA: Diagnosis not present

## 2021-04-19 ENCOUNTER — Other Ambulatory Visit: Payer: Self-pay | Admitting: Family Medicine

## 2021-04-19 DIAGNOSIS — Z23 Encounter for immunization: Secondary | ICD-10-CM | POA: Diagnosis not present

## 2021-04-19 DIAGNOSIS — E2839 Other primary ovarian failure: Secondary | ICD-10-CM

## 2021-04-28 ENCOUNTER — Ambulatory Visit
Admission: RE | Admit: 2021-04-28 | Discharge: 2021-04-28 | Disposition: A | Discharge status: Home / Self Care | Payer: Medicare Other | Source: Ambulatory Visit | Attending: Family Medicine | Admitting: Family Medicine

## 2021-04-28 ENCOUNTER — Other Ambulatory Visit: Payer: Self-pay

## 2021-04-28 DIAGNOSIS — M85832 Other specified disorders of bone density and structure, left forearm: Secondary | ICD-10-CM | POA: Diagnosis not present

## 2021-04-28 DIAGNOSIS — E2839 Other primary ovarian failure: Secondary | ICD-10-CM

## 2021-05-03 DIAGNOSIS — L910 Hypertrophic scar: Secondary | ICD-10-CM | POA: Diagnosis not present

## 2021-05-03 DIAGNOSIS — L570 Actinic keratosis: Secondary | ICD-10-CM | POA: Diagnosis not present

## 2021-05-14 DIAGNOSIS — J320 Chronic maxillary sinusitis: Secondary | ICD-10-CM | POA: Diagnosis not present

## 2021-05-14 DIAGNOSIS — J302 Other seasonal allergic rhinitis: Secondary | ICD-10-CM | POA: Diagnosis not present

## 2021-05-28 DIAGNOSIS — S0012XA Contusion of left eyelid and periocular area, initial encounter: Secondary | ICD-10-CM | POA: Diagnosis not present

## 2021-05-31 DIAGNOSIS — H04563 Stenosis of bilateral lacrimal punctum: Secondary | ICD-10-CM | POA: Diagnosis not present

## 2021-05-31 DIAGNOSIS — H04223 Epiphora due to insufficient drainage, bilateral lacrimal glands: Secondary | ICD-10-CM | POA: Diagnosis not present

## 2021-05-31 DIAGNOSIS — Z09 Encounter for follow-up examination after completed treatment for conditions other than malignant neoplasm: Secondary | ICD-10-CM | POA: Diagnosis not present

## 2021-06-05 DIAGNOSIS — Z23 Encounter for immunization: Secondary | ICD-10-CM | POA: Diagnosis not present

## 2021-06-23 DIAGNOSIS — R519 Headache, unspecified: Secondary | ICD-10-CM | POA: Diagnosis not present

## 2021-06-28 DIAGNOSIS — L910 Hypertrophic scar: Secondary | ICD-10-CM | POA: Diagnosis not present

## 2021-06-28 DIAGNOSIS — Z23 Encounter for immunization: Secondary | ICD-10-CM | POA: Diagnosis not present

## 2021-09-27 DIAGNOSIS — Z85828 Personal history of other malignant neoplasm of skin: Secondary | ICD-10-CM | POA: Diagnosis not present

## 2021-09-27 DIAGNOSIS — Z23 Encounter for immunization: Secondary | ICD-10-CM | POA: Diagnosis not present

## 2021-09-27 DIAGNOSIS — D239 Other benign neoplasm of skin, unspecified: Secondary | ICD-10-CM | POA: Diagnosis not present

## 2021-09-27 DIAGNOSIS — L821 Other seborrheic keratosis: Secondary | ICD-10-CM | POA: Diagnosis not present

## 2021-09-27 DIAGNOSIS — D225 Melanocytic nevi of trunk: Secondary | ICD-10-CM | POA: Diagnosis not present

## 2021-09-27 DIAGNOSIS — L578 Other skin changes due to chronic exposure to nonionizing radiation: Secondary | ICD-10-CM | POA: Diagnosis not present

## 2021-09-27 DIAGNOSIS — L814 Other melanin hyperpigmentation: Secondary | ICD-10-CM | POA: Diagnosis not present

## 2021-10-19 DIAGNOSIS — F419 Anxiety disorder, unspecified: Secondary | ICD-10-CM | POA: Diagnosis not present

## 2021-10-19 DIAGNOSIS — E785 Hyperlipidemia, unspecified: Secondary | ICD-10-CM | POA: Diagnosis not present

## 2021-10-19 DIAGNOSIS — I700 Atherosclerosis of aorta: Secondary | ICD-10-CM | POA: Diagnosis not present

## 2021-10-19 DIAGNOSIS — G4700 Insomnia, unspecified: Secondary | ICD-10-CM | POA: Diagnosis not present

## 2021-10-19 DIAGNOSIS — I10 Essential (primary) hypertension: Secondary | ICD-10-CM | POA: Diagnosis not present

## 2021-11-22 DIAGNOSIS — Z9013 Acquired absence of bilateral breasts and nipples: Secondary | ICD-10-CM | POA: Diagnosis not present

## 2021-11-22 DIAGNOSIS — D0510 Intraductal carcinoma in situ of unspecified breast: Secondary | ICD-10-CM | POA: Diagnosis not present

## 2021-12-06 DIAGNOSIS — H0014 Chalazion left upper eyelid: Secondary | ICD-10-CM | POA: Diagnosis not present

## 2021-12-24 DIAGNOSIS — H04223 Epiphora due to insufficient drainage, bilateral lacrimal glands: Secondary | ICD-10-CM | POA: Diagnosis not present

## 2021-12-24 DIAGNOSIS — H52202 Unspecified astigmatism, left eye: Secondary | ICD-10-CM | POA: Diagnosis not present

## 2021-12-24 DIAGNOSIS — H04123 Dry eye syndrome of bilateral lacrimal glands: Secondary | ICD-10-CM | POA: Diagnosis not present

## 2022-02-02 DIAGNOSIS — H04123 Dry eye syndrome of bilateral lacrimal glands: Secondary | ICD-10-CM | POA: Diagnosis not present

## 2022-02-15 DIAGNOSIS — H35371 Puckering of macula, right eye: Secondary | ICD-10-CM | POA: Diagnosis not present

## 2022-02-15 DIAGNOSIS — Z961 Presence of intraocular lens: Secondary | ICD-10-CM | POA: Diagnosis not present

## 2022-03-08 DIAGNOSIS — N390 Urinary tract infection, site not specified: Secondary | ICD-10-CM | POA: Diagnosis not present

## 2022-03-14 DIAGNOSIS — B349 Viral infection, unspecified: Secondary | ICD-10-CM | POA: Diagnosis not present

## 2022-03-14 DIAGNOSIS — J387 Other diseases of larynx: Secondary | ICD-10-CM | POA: Diagnosis not present

## 2022-03-14 DIAGNOSIS — Z03818 Encounter for observation for suspected exposure to other biological agents ruled out: Secondary | ICD-10-CM | POA: Diagnosis not present

## 2022-03-21 DIAGNOSIS — F419 Anxiety disorder, unspecified: Secondary | ICD-10-CM | POA: Diagnosis not present

## 2022-03-21 DIAGNOSIS — K219 Gastro-esophageal reflux disease without esophagitis: Secondary | ICD-10-CM | POA: Diagnosis not present

## 2022-03-21 DIAGNOSIS — R1010 Upper abdominal pain, unspecified: Secondary | ICD-10-CM | POA: Diagnosis not present

## 2022-03-21 DIAGNOSIS — K589 Irritable bowel syndrome without diarrhea: Secondary | ICD-10-CM | POA: Diagnosis not present

## 2022-04-26 DIAGNOSIS — L299 Pruritus, unspecified: Secondary | ICD-10-CM | POA: Diagnosis not present

## 2022-04-26 DIAGNOSIS — I10 Essential (primary) hypertension: Secondary | ICD-10-CM | POA: Diagnosis not present

## 2022-04-26 DIAGNOSIS — F419 Anxiety disorder, unspecified: Secondary | ICD-10-CM | POA: Diagnosis not present

## 2022-04-26 DIAGNOSIS — K589 Irritable bowel syndrome without diarrhea: Secondary | ICD-10-CM | POA: Diagnosis not present

## 2022-04-26 DIAGNOSIS — E785 Hyperlipidemia, unspecified: Secondary | ICD-10-CM | POA: Diagnosis not present

## 2022-04-26 DIAGNOSIS — Z9013 Acquired absence of bilateral breasts and nipples: Secondary | ICD-10-CM | POA: Diagnosis not present

## 2022-04-26 DIAGNOSIS — M8588 Other specified disorders of bone density and structure, other site: Secondary | ICD-10-CM | POA: Diagnosis not present

## 2022-04-26 DIAGNOSIS — K219 Gastro-esophageal reflux disease without esophagitis: Secondary | ICD-10-CM | POA: Diagnosis not present

## 2022-04-26 DIAGNOSIS — Z0000 Encounter for general adult medical examination without abnormal findings: Secondary | ICD-10-CM | POA: Diagnosis not present

## 2022-04-26 DIAGNOSIS — G4700 Insomnia, unspecified: Secondary | ICD-10-CM | POA: Diagnosis not present

## 2022-04-26 DIAGNOSIS — E559 Vitamin D deficiency, unspecified: Secondary | ICD-10-CM | POA: Diagnosis not present

## 2022-04-26 DIAGNOSIS — I700 Atherosclerosis of aorta: Secondary | ICD-10-CM | POA: Diagnosis not present

## 2022-05-06 DIAGNOSIS — Z23 Encounter for immunization: Secondary | ICD-10-CM | POA: Diagnosis not present

## 2022-06-06 DIAGNOSIS — R197 Diarrhea, unspecified: Secondary | ICD-10-CM | POA: Diagnosis not present

## 2022-06-06 DIAGNOSIS — Z23 Encounter for immunization: Secondary | ICD-10-CM | POA: Diagnosis not present

## 2022-06-06 DIAGNOSIS — K589 Irritable bowel syndrome without diarrhea: Secondary | ICD-10-CM | POA: Diagnosis not present

## 2022-06-06 DIAGNOSIS — Z79899 Other long term (current) drug therapy: Secondary | ICD-10-CM | POA: Diagnosis not present

## 2022-06-06 DIAGNOSIS — K219 Gastro-esophageal reflux disease without esophagitis: Secondary | ICD-10-CM | POA: Diagnosis not present

## 2022-06-08 DIAGNOSIS — R197 Diarrhea, unspecified: Secondary | ICD-10-CM | POA: Diagnosis not present

## 2022-06-08 DIAGNOSIS — K219 Gastro-esophageal reflux disease without esophagitis: Secondary | ICD-10-CM | POA: Diagnosis not present

## 2022-06-08 DIAGNOSIS — Z79899 Other long term (current) drug therapy: Secondary | ICD-10-CM | POA: Diagnosis not present

## 2022-06-21 ENCOUNTER — Telehealth: Payer: Self-pay | Admitting: Gastroenterology

## 2022-06-21 NOTE — Telephone Encounter (Signed)
Good Morning Dr.Cirigliano,  We received a referral on this patient to be seen for GERD. She has GI hx at [facility] and Dr.Medoff. She was a long standing patient with [facility] and she states each visit she tried to get help with her GERD, they prescribed her a new medication and nothing help. She then went to Dr.Medoff to get a second opinion, since then he has retired. The patient then said she tried to get in with [facility] to keep managing her GERD but they would not accept her due to being seen by another GI specialist. Her pcp has prescribed her nexum and sent a referral for her to be seen to further manage of her symptoms, the patient has requested you has her provider. I advised patient that if she was accepted in our practice she could not have two GI doctors, the patient understood and was unaware of this policy when she tried to go back with [facility].  We have records for review, please advise on scheduling. Thank you.

## 2022-06-22 DIAGNOSIS — F419 Anxiety disorder, unspecified: Secondary | ICD-10-CM | POA: Diagnosis not present

## 2022-06-22 DIAGNOSIS — K589 Irritable bowel syndrome without diarrhea: Secondary | ICD-10-CM | POA: Diagnosis not present

## 2022-06-22 DIAGNOSIS — K219 Gastro-esophageal reflux disease without esophagitis: Secondary | ICD-10-CM | POA: Diagnosis not present

## 2022-06-22 DIAGNOSIS — R197 Diarrhea, unspecified: Secondary | ICD-10-CM | POA: Diagnosis not present

## 2022-06-24 NOTE — Telephone Encounter (Signed)
Records received and reviewed and notable for the following:  - 2006: Colonoscopy (Dr. Collene Mares): No results for review but reportedly notable for polyps - 09/25/2009: Colonoscopy (Dr. Paulita Fujita): Benign ascending/descending colon polyps, sigmoid hyperplastic polyps.  External hemorrhoids.  Recommended 5-year recall given prior history of polyps - 06/03/2015: Follow-up appointment in [facility] for abdominal pain - 07/17/2015: Colonoscopy (Dr. Paulita Fujita): Normal.  Repeat in 10 years - 01/18/2016: Evaluated by Dr. Earlean Shawl for diarrhea, fecal urgency, incontinence.  Previously with remote history of constipation.  Prior to that appointment, had been previously treated at [facility] with Viberzi, Imodium, Lomotil, cholestyramine, Creon, ranitidine, Uceris.  Did have resolution of symptoms with Uceris after only 4 days of use in 2017.  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  Referral now is for evaluation and treatment of GERD.  I think it is very reasonable to schedule OV with me or one of the APP's for evaluation and discussion of treatment options for reflux.

## 2022-06-24 NOTE — Telephone Encounter (Signed)
Pt scheduled with Vicie Mutters PA on 07/28/22 at 3 pm. Pt verbalized understanding.

## 2022-06-24 NOTE — Telephone Encounter (Signed)
Left message for pt to call back  °

## 2022-07-05 DIAGNOSIS — R197 Diarrhea, unspecified: Secondary | ICD-10-CM | POA: Diagnosis not present

## 2022-07-08 DIAGNOSIS — M25551 Pain in right hip: Secondary | ICD-10-CM | POA: Diagnosis not present

## 2022-07-28 ENCOUNTER — Ambulatory Visit: Payer: Medicare Other | Admitting: Physician Assistant

## 2022-08-19 ENCOUNTER — Ambulatory Visit: Payer: Medicare Other | Admitting: Gastroenterology

## 2022-09-21 DIAGNOSIS — K219 Gastro-esophageal reflux disease without esophagitis: Secondary | ICD-10-CM | POA: Diagnosis not present

## 2022-09-27 DIAGNOSIS — H5213 Myopia, bilateral: Secondary | ICD-10-CM | POA: Diagnosis not present

## 2022-10-10 DIAGNOSIS — L570 Actinic keratosis: Secondary | ICD-10-CM | POA: Diagnosis not present

## 2022-10-10 DIAGNOSIS — L814 Other melanin hyperpigmentation: Secondary | ICD-10-CM | POA: Diagnosis not present

## 2022-10-10 DIAGNOSIS — D225 Melanocytic nevi of trunk: Secondary | ICD-10-CM | POA: Diagnosis not present

## 2022-10-10 DIAGNOSIS — D239 Other benign neoplasm of skin, unspecified: Secondary | ICD-10-CM | POA: Diagnosis not present

## 2022-10-10 DIAGNOSIS — L821 Other seborrheic keratosis: Secondary | ICD-10-CM | POA: Diagnosis not present

## 2022-10-12 DIAGNOSIS — H52203 Unspecified astigmatism, bilateral: Secondary | ICD-10-CM | POA: Diagnosis not present

## 2022-10-26 DIAGNOSIS — E785 Hyperlipidemia, unspecified: Secondary | ICD-10-CM | POA: Diagnosis not present

## 2022-10-26 DIAGNOSIS — F419 Anxiety disorder, unspecified: Secondary | ICD-10-CM | POA: Diagnosis not present

## 2022-10-26 DIAGNOSIS — I10 Essential (primary) hypertension: Secondary | ICD-10-CM | POA: Diagnosis not present

## 2022-10-26 DIAGNOSIS — I700 Atherosclerosis of aorta: Secondary | ICD-10-CM | POA: Diagnosis not present

## 2022-11-28 DIAGNOSIS — Z9013 Acquired absence of bilateral breasts and nipples: Secondary | ICD-10-CM | POA: Diagnosis not present

## 2022-11-28 DIAGNOSIS — D0510 Intraductal carcinoma in situ of unspecified breast: Secondary | ICD-10-CM | POA: Diagnosis not present

## 2023-03-15 DIAGNOSIS — L290 Pruritus ani: Secondary | ICD-10-CM | POA: Diagnosis not present

## 2023-03-15 DIAGNOSIS — K644 Residual hemorrhoidal skin tags: Secondary | ICD-10-CM | POA: Diagnosis not present

## 2023-05-08 DIAGNOSIS — I700 Atherosclerosis of aorta: Secondary | ICD-10-CM | POA: Diagnosis not present

## 2023-05-08 DIAGNOSIS — E559 Vitamin D deficiency, unspecified: Secondary | ICD-10-CM | POA: Diagnosis not present

## 2023-05-08 DIAGNOSIS — M79641 Pain in right hand: Secondary | ICD-10-CM | POA: Diagnosis not present

## 2023-05-08 DIAGNOSIS — I10 Essential (primary) hypertension: Secondary | ICD-10-CM | POA: Diagnosis not present

## 2023-05-08 DIAGNOSIS — E538 Deficiency of other specified B group vitamins: Secondary | ICD-10-CM | POA: Diagnosis not present

## 2023-05-08 DIAGNOSIS — F419 Anxiety disorder, unspecified: Secondary | ICD-10-CM | POA: Diagnosis not present

## 2023-05-08 DIAGNOSIS — E785 Hyperlipidemia, unspecified: Secondary | ICD-10-CM | POA: Diagnosis not present

## 2023-05-08 DIAGNOSIS — Z0000 Encounter for general adult medical examination without abnormal findings: Secondary | ICD-10-CM | POA: Diagnosis not present

## 2023-05-10 ENCOUNTER — Other Ambulatory Visit: Payer: Self-pay | Admitting: Family Medicine

## 2023-05-10 DIAGNOSIS — M8580 Other specified disorders of bone density and structure, unspecified site: Secondary | ICD-10-CM

## 2023-05-29 DIAGNOSIS — K080 Exfoliation of teeth due to systemic causes: Secondary | ICD-10-CM | POA: Diagnosis not present

## 2023-06-09 DIAGNOSIS — C50911 Malignant neoplasm of unspecified site of right female breast: Secondary | ICD-10-CM | POA: Diagnosis not present

## 2023-06-09 DIAGNOSIS — C50912 Malignant neoplasm of unspecified site of left female breast: Secondary | ICD-10-CM | POA: Diagnosis not present

## 2023-06-13 DIAGNOSIS — C50912 Malignant neoplasm of unspecified site of left female breast: Secondary | ICD-10-CM | POA: Diagnosis not present

## 2023-06-13 DIAGNOSIS — C50911 Malignant neoplasm of unspecified site of right female breast: Secondary | ICD-10-CM | POA: Diagnosis not present

## 2023-06-29 DIAGNOSIS — G5603 Carpal tunnel syndrome, bilateral upper limbs: Secondary | ICD-10-CM | POA: Diagnosis not present

## 2023-06-29 DIAGNOSIS — G5601 Carpal tunnel syndrome, right upper limb: Secondary | ICD-10-CM | POA: Diagnosis not present

## 2023-06-29 DIAGNOSIS — S66801A Unspecified injury of other specified muscles, fascia and tendons at wrist and hand level, right hand, initial encounter: Secondary | ICD-10-CM | POA: Diagnosis not present

## 2023-06-29 DIAGNOSIS — M180 Bilateral primary osteoarthritis of first carpometacarpal joints: Secondary | ICD-10-CM | POA: Diagnosis not present

## 2023-06-29 DIAGNOSIS — G5602 Carpal tunnel syndrome, left upper limb: Secondary | ICD-10-CM | POA: Diagnosis not present

## 2023-07-11 DIAGNOSIS — E538 Deficiency of other specified B group vitamins: Secondary | ICD-10-CM | POA: Diagnosis not present

## 2023-08-28 DIAGNOSIS — G5603 Carpal tunnel syndrome, bilateral upper limbs: Secondary | ICD-10-CM | POA: Diagnosis not present

## 2023-08-28 DIAGNOSIS — S66801A Unspecified injury of other specified muscles, fascia and tendons at wrist and hand level, right hand, initial encounter: Secondary | ICD-10-CM | POA: Diagnosis not present

## 2023-08-28 DIAGNOSIS — M13841 Other specified arthritis, right hand: Secondary | ICD-10-CM | POA: Diagnosis not present

## 2023-09-14 DIAGNOSIS — E538 Deficiency of other specified B group vitamins: Secondary | ICD-10-CM | POA: Diagnosis not present

## 2023-09-14 DIAGNOSIS — F419 Anxiety disorder, unspecified: Secondary | ICD-10-CM | POA: Diagnosis not present

## 2023-09-14 DIAGNOSIS — L989 Disorder of the skin and subcutaneous tissue, unspecified: Secondary | ICD-10-CM | POA: Diagnosis not present

## 2023-09-14 DIAGNOSIS — G4700 Insomnia, unspecified: Secondary | ICD-10-CM | POA: Diagnosis not present

## 2023-10-13 DIAGNOSIS — H52203 Unspecified astigmatism, bilateral: Secondary | ICD-10-CM | POA: Diagnosis not present

## 2023-11-07 DIAGNOSIS — D225 Melanocytic nevi of trunk: Secondary | ICD-10-CM | POA: Diagnosis not present

## 2023-11-07 DIAGNOSIS — Z85828 Personal history of other malignant neoplasm of skin: Secondary | ICD-10-CM | POA: Diagnosis not present

## 2023-11-07 DIAGNOSIS — L821 Other seborrheic keratosis: Secondary | ICD-10-CM | POA: Diagnosis not present

## 2023-11-07 DIAGNOSIS — L570 Actinic keratosis: Secondary | ICD-10-CM | POA: Diagnosis not present

## 2023-11-07 DIAGNOSIS — L814 Other melanin hyperpigmentation: Secondary | ICD-10-CM | POA: Diagnosis not present

## 2023-11-07 DIAGNOSIS — D485 Neoplasm of uncertain behavior of skin: Secondary | ICD-10-CM | POA: Diagnosis not present

## 2023-11-08 DIAGNOSIS — F419 Anxiety disorder, unspecified: Secondary | ICD-10-CM | POA: Diagnosis not present

## 2023-11-08 DIAGNOSIS — E785 Hyperlipidemia, unspecified: Secondary | ICD-10-CM | POA: Diagnosis not present

## 2023-11-08 DIAGNOSIS — I700 Atherosclerosis of aorta: Secondary | ICD-10-CM | POA: Diagnosis not present

## 2023-11-08 DIAGNOSIS — I10 Essential (primary) hypertension: Secondary | ICD-10-CM | POA: Diagnosis not present

## 2023-11-10 DIAGNOSIS — B029 Zoster without complications: Secondary | ICD-10-CM | POA: Diagnosis not present

## 2023-11-28 DIAGNOSIS — L820 Inflamed seborrheic keratosis: Secondary | ICD-10-CM | POA: Diagnosis not present

## 2023-11-28 DIAGNOSIS — L570 Actinic keratosis: Secondary | ICD-10-CM | POA: Diagnosis not present

## 2023-11-28 DIAGNOSIS — M79671 Pain in right foot: Secondary | ICD-10-CM | POA: Diagnosis not present

## 2023-12-01 DIAGNOSIS — Z9013 Acquired absence of bilateral breasts and nipples: Secondary | ICD-10-CM | POA: Diagnosis not present

## 2023-12-01 DIAGNOSIS — D0510 Intraductal carcinoma in situ of unspecified breast: Secondary | ICD-10-CM | POA: Diagnosis not present

## 2023-12-05 DIAGNOSIS — K080 Exfoliation of teeth due to systemic causes: Secondary | ICD-10-CM | POA: Diagnosis not present

## 2023-12-12 DIAGNOSIS — L820 Inflamed seborrheic keratosis: Secondary | ICD-10-CM | POA: Diagnosis not present

## 2023-12-12 DIAGNOSIS — B0229 Other postherpetic nervous system involvement: Secondary | ICD-10-CM | POA: Diagnosis not present

## 2023-12-29 ENCOUNTER — Ambulatory Visit
Admission: RE | Admit: 2023-12-29 | Discharge: 2023-12-29 | Disposition: A | Discharge status: Home / Self Care | Payer: Medicare Other | Source: Ambulatory Visit | Attending: Family Medicine | Admitting: Family Medicine

## 2023-12-29 DIAGNOSIS — M8580 Other specified disorders of bone density and structure, unspecified site: Secondary | ICD-10-CM

## 2023-12-29 DIAGNOSIS — N958 Other specified menopausal and perimenopausal disorders: Secondary | ICD-10-CM | POA: Diagnosis not present

## 2023-12-29 DIAGNOSIS — M8588 Other specified disorders of bone density and structure, other site: Secondary | ICD-10-CM | POA: Diagnosis not present

## 2024-01-22 DIAGNOSIS — J0110 Acute frontal sinusitis, unspecified: Secondary | ICD-10-CM | POA: Diagnosis not present

## 2024-02-06 DIAGNOSIS — R519 Headache, unspecified: Secondary | ICD-10-CM | POA: Diagnosis not present

## 2024-02-06 DIAGNOSIS — J302 Other seasonal allergic rhinitis: Secondary | ICD-10-CM | POA: Diagnosis not present

## 2024-02-27 ENCOUNTER — Other Ambulatory Visit (HOSPITAL_COMMUNITY): Payer: Self-pay | Admitting: Physician Assistant

## 2024-02-27 DIAGNOSIS — R519 Headache, unspecified: Secondary | ICD-10-CM

## 2024-02-28 ENCOUNTER — Ambulatory Visit (HOSPITAL_COMMUNITY)
Admission: RE | Admit: 2024-02-28 | Discharge: 2024-02-28 | Disposition: A | Discharge status: Home / Self Care | Source: Ambulatory Visit | Attending: Physician Assistant | Admitting: Physician Assistant

## 2024-02-28 DIAGNOSIS — R519 Headache, unspecified: Secondary | ICD-10-CM | POA: Diagnosis not present

## 2024-02-28 DIAGNOSIS — I6782 Cerebral ischemia: Secondary | ICD-10-CM | POA: Diagnosis not present

## 2024-02-28 DIAGNOSIS — R9082 White matter disease, unspecified: Secondary | ICD-10-CM | POA: Diagnosis not present

## 2024-02-28 MED ORDER — GADOBUTROL 1 MMOL/ML IV SOLN
8.0000 mL | Freq: Once | INTRAVENOUS | Status: AC | PRN
  Administered 2024-02-28: 8 mL via INTRAVENOUS

## 2024-03-05 DIAGNOSIS — D044 Carcinoma in situ of skin of scalp and neck: Secondary | ICD-10-CM | POA: Diagnosis not present

## 2024-03-05 DIAGNOSIS — L820 Inflamed seborrheic keratosis: Secondary | ICD-10-CM | POA: Diagnosis not present

## 2024-03-05 DIAGNOSIS — D485 Neoplasm of uncertain behavior of skin: Secondary | ICD-10-CM | POA: Diagnosis not present

## 2024-03-05 DIAGNOSIS — R208 Other disturbances of skin sensation: Secondary | ICD-10-CM | POA: Diagnosis not present

## 2024-03-19 DIAGNOSIS — C4442 Squamous cell carcinoma of skin of scalp and neck: Secondary | ICD-10-CM | POA: Diagnosis not present

## 2024-05-13 DIAGNOSIS — R61 Generalized hyperhidrosis: Secondary | ICD-10-CM | POA: Diagnosis not present

## 2024-05-13 DIAGNOSIS — I700 Atherosclerosis of aorta: Secondary | ICD-10-CM | POA: Diagnosis not present

## 2024-05-13 DIAGNOSIS — Z853 Personal history of malignant neoplasm of breast: Secondary | ICD-10-CM | POA: Diagnosis not present

## 2024-05-13 DIAGNOSIS — Z0000 Encounter for general adult medical examination without abnormal findings: Secondary | ICD-10-CM | POA: Diagnosis not present

## 2024-05-13 DIAGNOSIS — E785 Hyperlipidemia, unspecified: Secondary | ICD-10-CM | POA: Diagnosis not present

## 2024-05-13 DIAGNOSIS — E559 Vitamin D deficiency, unspecified: Secondary | ICD-10-CM | POA: Diagnosis not present

## 2024-05-13 DIAGNOSIS — F419 Anxiety disorder, unspecified: Secondary | ICD-10-CM | POA: Diagnosis not present

## 2024-05-13 DIAGNOSIS — E538 Deficiency of other specified B group vitamins: Secondary | ICD-10-CM | POA: Diagnosis not present

## 2024-05-13 DIAGNOSIS — I10 Essential (primary) hypertension: Secondary | ICD-10-CM | POA: Diagnosis not present

## 2024-07-01 DIAGNOSIS — K080 Exfoliation of teeth due to systemic causes: Secondary | ICD-10-CM | POA: Diagnosis not present
